# Patient Record
Sex: Female | Born: 1980 | Race: Black or African American | Hispanic: No | Marital: Married | Smoking: Never smoker
Health system: Southern US, Community
[De-identification: ages and names within clinical notes are randomized; demographics above are authoritative.]

## PROBLEM LIST (undated history)

## (undated) DIAGNOSIS — F419 Anxiety disorder, unspecified: Secondary | ICD-10-CM

## (undated) DIAGNOSIS — Z803 Family history of malignant neoplasm of breast: Secondary | ICD-10-CM

## (undated) DIAGNOSIS — Z9889 Other specified postprocedural states: Secondary | ICD-10-CM

## (undated) DIAGNOSIS — L309 Dermatitis, unspecified: Secondary | ICD-10-CM

## (undated) DIAGNOSIS — C801 Malignant (primary) neoplasm, unspecified: Secondary | ICD-10-CM

## (undated) DIAGNOSIS — J189 Pneumonia, unspecified organism: Secondary | ICD-10-CM

## (undated) DIAGNOSIS — Z8042 Family history of malignant neoplasm of prostate: Secondary | ICD-10-CM

## (undated) DIAGNOSIS — C50919 Malignant neoplasm of unspecified site of unspecified female breast: Secondary | ICD-10-CM

## (undated) DIAGNOSIS — R112 Nausea with vomiting, unspecified: Secondary | ICD-10-CM

## (undated) DIAGNOSIS — M1990 Unspecified osteoarthritis, unspecified site: Secondary | ICD-10-CM

## (undated) HISTORY — PX: THROAT SURGERY: SHX803

## (undated) HISTORY — DX: Dermatitis, unspecified: L30.9

## (undated) HISTORY — PX: SHOULDER SURGERY: SHX246

## (undated) HISTORY — PX: BREAST LUMPECTOMY: SHX2

## (undated) HISTORY — PX: ANKLE ARTHROSCOPY: SUR85

## (undated) HISTORY — DX: Family history of malignant neoplasm of breast: Z80.3

## (undated) HISTORY — PX: TARSAL TUNNEL RELEASE: SUR1099

## (undated) HISTORY — DX: Family history of malignant neoplasm of prostate: Z80.42

## (undated) HISTORY — PX: HIP SURGERY: SHX245

---

## 2001-02-20 ENCOUNTER — Emergency Department (HOSPITAL_COMMUNITY): Admission: EM | Admit: 2001-02-20 | Discharge: 2001-02-21 | Payer: Self-pay | Admitting: Emergency Medicine

## 2001-02-21 ENCOUNTER — Encounter: Payer: Self-pay | Admitting: Emergency Medicine

## 2002-08-07 ENCOUNTER — Emergency Department (HOSPITAL_COMMUNITY): Admission: EM | Admit: 2002-08-07 | Discharge: 2002-08-07 | Payer: Self-pay | Admitting: Emergency Medicine

## 2002-08-07 ENCOUNTER — Encounter: Payer: Self-pay | Admitting: Emergency Medicine

## 2003-05-24 ENCOUNTER — Encounter (INDEPENDENT_AMBULATORY_CARE_PROVIDER_SITE_OTHER): Payer: Self-pay | Admitting: *Deleted

## 2003-05-24 ENCOUNTER — Ambulatory Visit (HOSPITAL_BASED_OUTPATIENT_CLINIC_OR_DEPARTMENT_OTHER): Admission: RE | Admit: 2003-05-24 | Discharge: 2003-05-24 | Payer: Self-pay | Admitting: Otolaryngology

## 2003-05-25 ENCOUNTER — Encounter: Admission: RE | Admit: 2003-05-25 | Discharge: 2003-06-21 | Payer: Self-pay | Admitting: Otolaryngology

## 2004-01-24 ENCOUNTER — Emergency Department (HOSPITAL_COMMUNITY): Admission: EM | Admit: 2004-01-24 | Discharge: 2004-01-24 | Payer: Self-pay | Admitting: Emergency Medicine

## 2004-09-17 ENCOUNTER — Emergency Department: Payer: Self-pay | Admitting: Emergency Medicine

## 2005-03-05 ENCOUNTER — Inpatient Hospital Stay (HOSPITAL_COMMUNITY): Admission: AD | Admit: 2005-03-05 | Discharge: 2005-03-05 | Payer: Self-pay | Admitting: Obstetrics and Gynecology

## 2005-03-23 ENCOUNTER — Inpatient Hospital Stay (HOSPITAL_COMMUNITY): Admission: AD | Admit: 2005-03-23 | Discharge: 2005-03-26 | Payer: Self-pay | Admitting: Obstetrics and Gynecology

## 2010-10-21 ENCOUNTER — Inpatient Hospital Stay (HOSPITAL_COMMUNITY): Admission: AD | Admit: 2010-10-21 | Payer: Self-pay | Admitting: Obstetrics and Gynecology

## 2011-03-09 NOTE — H&P (Signed)
NAME:  DESARE, DUDDY NO.:  [id number]   MEDICAL RECORD NO.:  [id number]          PATIENT TYPE:  INP   LOCATION:  9169                          FACILITY:  [facility]   PHYSICIAN:  Lenoard Aden, M.D.DATE OF BIRTH:  January 22, 1981   DATE OF ADMISSION:  03/23/2005  DATE OF DISCHARGE:                                HISTORY & PHYSICAL   CHIEF COMPLAINT:  Post dates, presumed large for gestational age for  induction.   HISTORY OF PRESENT ILLNESS:  The patient is a 30 year old African-American  female G1, P0, EDC of March 27, 2005, at 39+ weeks with presumed LGA for  induction.   PAST MEDICAL HISTORY:  1.  Laryngoscopy in 2004 for a nodule, some question of granuloma.  2.  History of ankle surgery.  3.  She has a history of urinary tract infection.  4.  She has a history of a LEEP for abnormal Pap smear.   FAMILY HISTORY:  Diabetes, heart disease and smoking abuse.   PRENATAL LAB DATA:  Blood type O positive. Rubella immune. Hepatitis, HIV  negative.   PHYSICAL EXAMINATION:  GENERAL:  Well-developed, well-nourished African-  American female in no acute distress.  HEENT:  Normal.  LUNGS:  Clear.  HEART:  Regular rhythm.  ABDOMEN:  Soft, gravid, nontender. Estimated fetal weight 8 pounds.  PELVIC:  Cervix is 3-4 cm, 80%, vertex, 0 station.  EXTREMITIES:  Normal.  NEUROLOGIC EXAM:  Nonfocal.   IMPRESSION:  1.  Forty-week intrauterine pregnancy.  2.  Presumed large for gestational age.  3.  History of LEEP.  4.  Father of baby with sickle cell trait. Patient with normal hemoglobin      electrophoresis.   PLAN:  Proceed with induction. Risks, benefits discussed. Epidural as  needed.      RJT/MEDQ  D:  03/23/2005  T:  03/23/2005  Job:  [id number]   cc:   [facility]

## 2011-03-09 NOTE — Op Note (Signed)
   Heather, Delacruz                        ACCOUNT NO.:  [id number]   MEDICAL RECORD NO.:  [id number]                   PATIENT TYPE:  AMB   LOCATION:  DSC                                  FACILITY:  [facility]   PHYSICIAN:  Jefry H. Pollyann Kennedy, M.D.                DATE OF BIRTH:  [birth year]   DATE OF PROCEDURE:  05/24/2003  DATE OF DISCHARGE:                                 OPERATIVE REPORT   PREOPERATIVE DIAGNOSIS:  Vocal cord granulomata with severe hoarseness.   POSTOPERATIVE DIAGNOSIS:  Vocal cord granulomata with severe hoarseness.   PROCEDURE:  Microlaryngoscopy with excision of bilateral vocal cord  granulomata.   SURGEON:  Jefry H. Pollyann Kennedy, M.D.   General endotracheal anesthesia was used.  No complications.  No blood loss.   FINDINGS:  Bilateral anterior third vocal cord granulomata, otherwise normal  examination of the larynx.   HISTORY:  This is a 30 year old who has had severe hoarseness for many  years, most of her adult life.  Has undergone speech therapy without any  significant improvement.  She has had known vocal nodules for many years.  She underwent a microlaryngoscopy approximately two months prior and  developed severe hoarseness and straining of the voice again in the  postoperative period that has failed to resolve with medical management.  The risks, benefits, alternatives, and complications of the procedure were  explained to the patient, who seemed to understand and agreed to surgery.   DESCRIPTION OF PROCEDURE:  The patient was taken to the operating room and  placed on the operating table in a supine position.  Following induction of  general endotracheal anesthesia, the table was turned 90 degrees and the  patient was draped in a standard fashion.  A Dedo laryngoscope was entered  into the oral cavity and used to view the laryngeal structures.  It was  attached to the suspension apparatus without difficulty.  The camera and  microscope were brought into  view.  The larynx was examined.  The two  lesions were excised using sharp dissection, scissors, and grasping forceps.  They were sent together for pathologic evaluation.  The base of the lesion  was vascular bilaterally, and adrenalin-soaked pledgets were placed to  provide hemostasis.  The edges of the mucosa were all basically free.  No  further dissection was accomplished.  The laryngoscope was removed.  The  patient was awakened, extubated, and transferred to recovery in stable  condition.                                               Jefry H. Pollyann Kennedy, M.D.    JHR/MEDQ  D:  05/24/2003  T:  05/24/2003  Job:  [id number]

## 2011-03-22 ENCOUNTER — Inpatient Hospital Stay (HOSPITAL_COMMUNITY)
Admission: AD | Admit: 2011-03-22 | Discharge: 2011-03-22 | Disposition: A | Discharge status: Home / Self Care | Payer: BC Managed Care – PPO | Source: Ambulatory Visit | Attending: Obstetrics and Gynecology | Admitting: Obstetrics and Gynecology

## 2011-03-22 ENCOUNTER — Inpatient Hospital Stay (HOSPITAL_COMMUNITY): Payer: BC Managed Care – PPO

## 2011-03-22 ENCOUNTER — Inpatient Hospital Stay (HOSPITAL_COMMUNITY)
Admission: AD | Admit: 2011-03-22 | Discharge: 2011-03-24 | DRG: 373 | Disposition: A | Discharge status: Home / Self Care | Payer: BC Managed Care – PPO | Source: Ambulatory Visit | Attending: Obstetrics and Gynecology | Admitting: Obstetrics and Gynecology

## 2011-03-22 ENCOUNTER — Encounter (HOSPITAL_COMMUNITY): Payer: Self-pay | Admitting: *Deleted

## 2011-03-22 DIAGNOSIS — O479 False labor, unspecified: Secondary | ICD-10-CM | POA: Insufficient documentation

## 2011-03-22 LAB — CBC
Hemoglobin: 10.3 g/dL — ABNORMAL LOW (ref 12.0–15.0)
MCH: 26.5 pg (ref 26.0–34.0)
MCHC: 32.7 g/dL (ref 30.0–36.0)
Platelets: 231 10*3/uL (ref 150–400)
RDW: 14.8 % (ref 11.5–15.5)

## 2011-03-23 ENCOUNTER — Other Ambulatory Visit: Payer: Self-pay | Admitting: Obstetrics and Gynecology

## 2011-03-23 LAB — RPR: RPR Ser Ql: NONREACTIVE

## 2011-03-23 LAB — ABO/RH: ABO/RH(D): O POS

## 2011-03-23 NOTE — H&P (Signed)
  Heather Delacruz, KULIG            ACCOUNT NO.:  [id number]  MEDICAL RECORD NO.:  [id number]           PATIENT TYPE:  I  LOCATION:  9166                          FACILITY:  [facility]  PHYSICIAN:  Lenoard Aden, M.D.DATE OF BIRTH:  15-Nov-1980  DATE OF ADMISSION:  03/22/2011 DATE OF DISCHARGE:                             HISTORY & PHYSICAL   CHIEF COMPLAINT:  Spontaneous leakage of fluid.  HISTORY OF PRESENT ILLNESS:  She is a 30 year old African American female G2, P1 at 35 and 16 weeks' gestation who presents with questionable leakage of fluid.  She reports good fetal movement.  She denies bleeding, shortness of breath, or chest pain.  ALLERGIES:  She has no known drug allergies.  MEDICATIONS:  Prenatal vitamins.  FAMILY HISTORY:  Diabetes, drug abuse, hypertension, and bipolar disorder.  PAST MEDICAL HISTORY:  She has a history of vaginal delivery x1.  PAST SURGICAL HISTORY:  Remarkable for laryngoscopic surgery, LEEP, and ankle surgery.  She has a pregnancy course complicated by unexplained third trimester bleeding.  PHYSICAL EXAMINATION:  GENERAL:  She is a well-developed, well-nourished [place] American female in no acute distress. HEENT:  Normal. NECK:  Supple.  Full range of motion. LUNGS:  Clear. HEART:  Regular rhythm. ABDOMEN:  Soft, gravid, and nontender. BACK:  No CVA tenderness. EXTREMITIES:  No cords. NEUROLOGIC:  Nonfocal. SKIN:  Intact. PELVIS:  Cervical exam unchanged, 2 cm, 50% vertex, -2.  No fluid noted. NST reassuring but nonreactive.  BPP 8/8 with an AFI of 21.  IMPRESSION:  No evidence of spontaneous rupture of membranes at term.  PLAN:  Discharge home for admission tomorrow for induction at 39 weeks for history of unexplained third trimester bleeding.  Bleeding and leakage of fluid precautions given.     Lenoard Aden, M.D.     RJT/MEDQ  D:  03/22/2011  T:  03/22/2011  Job:  [id number]  Electronically Signed by Olivia Mackie M.D. on  03/23/2011 02:02:59 PM

## 2011-03-23 NOTE — H&P (Signed)
  Heather Delacruz, Heather Delacruz            ACCOUNT NO.:  [id number]  MEDICAL RECORD NO.:  [id number]           PATIENT TYPE:  LOCATION:                                 FACILITY:  PHYSICIAN:  Lenoard Aden, M.D.     DATE OF BIRTH:  DATE OF ADMISSION:  03/22/2011 DATE OF DISCHARGE:                             HISTORY & PHYSICAL   CHIEF COMPLAINT:  History of unexplained third-trimester bleeding for induction at 39 weeks.  HISTORY:  She is a 30 year old African American female G2, P1 at 69 weeks' gestation with history of bleeding and unexplained second and third-trimester for induction at 39 weeks.  She is a nonsmoker, nondrinker.  She denies domestic or physical violence.  She has no known drug allergies.  Medications are prenatal vitamins.  Family history of rheumatoid arthritis, diabetes, hypertension, bipolar disorder. Previous history of 7 pounds 6 ounces fetus born in 2005.  She has a surgical history remarkable for laryngoscopic surgery, LEEP, ankle surgery.  Prenatal course complicated by unexplained second-trimester bleeding and third-trimester bleeding in addition to a history of polyhydramnios resolved on most recent ultrasound.  PHYSICAL EXAM:  GENERAL:  Well-developed, well-nourished [place] American female, in no acute distress. HEENT:  Normal. NECK:  Supple.  Full range of motion. LUNGS:  Clear. HEART:  Regular rate and rhythm. ABDOMEN:  Soft, gravid, nontender.  Estimated fetal weight 7-8 pounds. Cervix is 2-3, 50% vertex, -2. EXTREMITIES:  There are no cords. NEUROLOGIC:  Nonfocal. SKIN:  Intact.  IMPRESSION: 1. A 39-week intrauterine pregnancy. 2. Unexplained second-trimester bleeding.  PLAN:  Proceed with cervical ripening and induction, Cervidil was placed and NST is reactive.     Lenoard Aden, M.D.     RJT/MEDQ  D:  03/22/2011  T:  03/22/2011  Job:  [id number]  Electronically Signed by Olivia Mackie M.D. on 03/23/2011 02:03:02 PM

## 2011-03-24 LAB — CBC
Hemoglobin: 9.1 g/dL — ABNORMAL LOW (ref 12.0–15.0)
MCH: 26.3 pg (ref 26.0–34.0)
MCHC: 32.4 g/dL (ref 30.0–36.0)
RDW: 14.9 % (ref 11.5–15.5)

## 2013-02-10 DIAGNOSIS — L309 Dermatitis, unspecified: Secondary | ICD-10-CM | POA: Insufficient documentation

## 2013-02-10 DIAGNOSIS — J381 Polyp of vocal cord and larynx: Secondary | ICD-10-CM | POA: Insufficient documentation

## 2013-02-10 DIAGNOSIS — M7580 Other shoulder lesions, unspecified shoulder: Secondary | ICD-10-CM | POA: Insufficient documentation

## 2014-03-02 LAB — TSH: TSH: 0.63 u[IU]/mL (ref <=5.90)

## 2014-03-11 ENCOUNTER — Ambulatory Visit (INDEPENDENT_AMBULATORY_CARE_PROVIDER_SITE_OTHER): Payer: BC Managed Care – PPO | Admitting: Internal Medicine

## 2014-03-11 ENCOUNTER — Encounter: Payer: Self-pay | Admitting: Internal Medicine

## 2014-03-11 VITALS — BP 122/82 | HR 72 | Temp 98.5°F | Resp 12 | Ht 66.0 in | Wt 193.8 lb

## 2014-03-11 DIAGNOSIS — E041 Nontoxic single thyroid nodule: Secondary | ICD-10-CM | POA: Insufficient documentation

## 2014-03-11 NOTE — Progress Notes (Signed)
Patient ID: Heather Delacruz, female   DOB: August 16, 1981, 33 y.o.   MRN: [id number]  HPI  Heather Delacruz is a 33 y.o.-year-old female, referred by Dr Brien Few fin consultation for a thyroid nodule.  She had a visit with Dr Ronita Hipps on 03/02/2014 >> he felt a nodule and referred her to me.  I reviewed pt's thyroid tests: Lab Results  Component Value Date   TSH 0.63 03/02/2014    Pt denies feeling nodules in neck, + hoarseness (vocal nodule surgery at 33 y/o), no dysphagia/odynophagia, SOB with lying down.  Pt c/o: - no heat intolerance/cold intolerance - no tremors - rarely palpitations - no anxiety/depression - no hyperdefecation/constipation - no weight loss - no weight gain - no dry skin - + hair falling >> better - + fatigue  Pt does not have a FH of thyroid ds. No FH of thyroid cancer. No h/o radiation tx to head or neck. No seaweed or kelp, no recent contrast studies. No steroid use. No herbal supplements.   I reviewed her chart and she also has a history of acne (on Spironolactone).  ROS: Constitutional: see HPI Eyes: no blurry vision, no xerophthalmia ENT: no sore throat, no nodules palpated in throat, no dysphagia/odynophagia, no hoarseness Cardiovascular: no CP/SOB/palpitations/leg swelling Respiratory: no cough/SOB Gastrointestinal: no N/V/D/C Musculoskeletal: + muscle aches/joint aches Skin: no rashes Neurological: no tremors/numbness/tingling/dizziness Psychiatric: no depression/anxiety  Past medical history: - Vitamin D deficiency - Eczema  Surgeries: - shoulder surgery 2013 - 2 focal nodules surgeries 2003/4 - tendon release in the right ankle 2000  History   Social History  . Marital Status: Married    Spouse Name: N/A    Number of Children: 2   Occupational History  . Physical therapist   Social History Main Topics  . Smoking status: Never Smoker   . Smokeless tobacco: No  . Alcohol Use: 1-2/mo  . Drug Use: No   Current Outpatient Rx   Name  Route  Sig  Dispense  Refill  . Omega-3 Fatty Acids (FISH OIL) 1000 MG CAPS   Oral   Take 1 capsule by mouth daily.         Marland Kitchen spironolactone (ALDACTONE) 50 MG tablet   Oral   Take 50 mg by mouth as needed.          . Vitamin D, Ergocalciferol, (DRISDOL) 50000 UNITS CAPS capsule   Oral   Take 50,000 Units by mouth as needed.          NKDA  FH: - diabetes mellitus in father - Hypertension in mother and father - Hyperlipidemia in mother - Cancer: prostate, in father  PE: BP 122/82  Pulse 72  Temp(Src) 98.5 F (36.9 C) (Oral)  Resp 12  Ht 5\' 6"  (1.676 m)  Wt 193 lb 12.8 oz (87.907 kg)  BMI 31.30 kg/m2  SpO2 98%  Breastfeeding? Unknown Wt Readings from Last 3 Encounters:  03/11/14 193 lb 12.8 oz (87.907 kg)   Constitutional: overweight, in NAD Eyes: PERRLA, EOMI, no exophthalmos ENT: moist mucous membranes, + ?thyroid nodule palpated in L side of isthmus or left lobe, no cervical lymphadenopathy Cardiovascular: RRR, No MRG Respiratory: CTA B Gastrointestinal: abdomen soft, NT, ND, BS+ Musculoskeletal: no deformities, strength intact in all 4;  Skin: moist, warm, no rashes Neurological: no tremor with outstretched hands, DTR normal in all 4  ASSESSMENT: 1. ?Thyroid nodule - palpated in the mid-left thyroid  PLAN: 1.  - Pt has a ?newly palpable ~ 1 cm  thyroid nodule. She does not have neck compression sxs. She does not have a thyroid cancer family history or a personal history of RxTx to head/neck. All these would favor benignity.  - we need a thyroid U/S to further characterize the nodule >> ordered - We discussed that if she has a nodule, the only way that we can tell exactly if it is cancer or not is by doing a thyroid biopsy (FNA).  - I explained that this is not cancer, we can continue to follow her on a yearly basis, and check another ultrasound in another year or 2. - I'll see her back in a year, assuming her FNA is normal. If FNA abnormal, we  will meet sooner.  - I advised pt to join my chart and I will send her the results through there   Orders Placed This Encounter  Procedures  . US Soft Tissue Head/Neck   CLINICAL DATA: This thyroid nodule  EXAM: THYROID ULTRASOUND  TECHNIQUE: Ultrasound examination of the thyroid gland and adjacent soft tissues was performed.  COMPARISON: None.  FINDINGS: Right thyroid lobe  Measurements: 5.0 x 1.7 x 1.7 cm. No nodules visualized.  Left thyroid lobe  Measurements: 3.7 x 1.5 x 1.3 cm. No nodules visualized.  Isthmus  Thickness: 4 mm in thickness. No nodules visualized.  Lymphadenopathy  None visualized.  IMPRESSION: No evidence of thyroid nodule.  No thyroid nodule - but the images show a more fluid-filled area on L side of isthmus, possibly a cyst that resorbed. No need for f/u in a year >> will let pt know.

## 2014-03-11 NOTE — Patient Instructions (Addendum)
I will order a thyroid Ultrasound in [facility].  You will be called to schedule this appt. Please join MyChart >> I will send you the results through there. Please return in 1 year.

## 2014-03-19 ENCOUNTER — Ambulatory Visit
Admission: RE | Admit: 2014-03-19 | Discharge: 2014-03-19 | Disposition: A | Discharge status: Home / Self Care | Payer: BC Managed Care – PPO | Source: Ambulatory Visit | Attending: Internal Medicine | Admitting: Internal Medicine

## 2014-03-19 ENCOUNTER — Encounter: Payer: Self-pay | Admitting: Internal Medicine

## 2014-08-16 DIAGNOSIS — B9689 Other specified bacterial agents as the cause of diseases classified elsewhere: Secondary | ICD-10-CM | POA: Insufficient documentation

## 2014-08-23 ENCOUNTER — Encounter: Payer: Self-pay | Admitting: Internal Medicine

## 2015-03-11 ENCOUNTER — Ambulatory Visit (INDEPENDENT_AMBULATORY_CARE_PROVIDER_SITE_OTHER): Payer: BLUE CROSS/BLUE SHIELD | Admitting: Internal Medicine

## 2015-03-11 ENCOUNTER — Encounter: Payer: Self-pay | Admitting: Internal Medicine

## 2015-03-11 VITALS — BP 132/80 | HR 75 | Temp 98.4°F | Resp 12 | Wt 184.0 lb

## 2015-03-11 DIAGNOSIS — E041 Nontoxic single thyroid nodule: Secondary | ICD-10-CM | POA: Diagnosis not present

## 2015-03-11 NOTE — Progress Notes (Signed)
Patient ID: Heather Delacruz, female   DOB: November 15, 1980, 34 y.o.   MRN: [id number]  HPI  [name] is a 34 y.o.-year-old female, referred by Dr Brien Few in consultation for a thyroid nodule.  She had a visit with Dr Ronita Hipps on 03/02/2014 >> he felt a nodule and referred her to me.   A thyroid U/S (03/19/2014) did not show a thyroid nodule:   Right thyroid lobe - Measurements: 5.0 x 1.7 x 1.7 cm. No nodules visualized.  Left thyroid lobe - Measurements: 3.7 x 1.5 x 1.3 cm. No nodules visualized.  Isthmus - Thickness: 4 mm in thickness. No nodules visualized.  Lymphadenopathy - None visualized. IMPRESSION: No evidence of thyroid nodule.  She tells me that Dr Ronita Hipps recently felt her thyroid nodule again.   I reviewed pt's thyroid tests: Lab Results  Component Value Date   TSH 0.63 03/02/2014    Pt denies feeling nodules in neck, + hoarseness (vocal nodule surgery at 34 y/o), no dysphagia/odynophagia, SOB with lying down.  Pt c/o: - + hair loss - no heat intolerance/cold intolerance - no tremors - rarely palpitations - no anxiety/depression - no hyperdefecation/constipation - no weight loss - no weight gain - no dry skin - no fatigue  ROS: Constitutional: see HPI Eyes: no blurry vision, no xerophthalmia ENT: no sore throat, no nodules palpated in throat, no dysphagia/odynophagia, no hoarseness Cardiovascular: no CP/SOB/palpitations/leg swelling Respiratory: no cough/SOB Gastrointestinal: no N/V/D/C Musculoskeletal: no muscle aches/+ joint aches Skin: no rashes Neurological: no tremors/numbness/tingling/dizziness  I reviewed pt's medications, allergies, PMH, social hx, family hx, and changes were documented in the history of present illness. Otherwise, unchanged from my initial visit note.  Past medical history: - Vitamin D deficiency - Eczema  Surgeries: - shoulder surgery 2013 - 2 focal nodules surgeries 2003/4 - tendon release in the right ankle  2000  History   Social History  . Marital Status: Married    Spouse Name: N/A    Number of Children: 2   Occupational History  . Physical therapist   Social History Main Topics  . Smoking status: Never Smoker   . Smokeless tobacco: No  . Alcohol Use: 1-2/mo  . Drug Use: No   Current Outpatient Rx  Name  Route  Sig  Dispense  Refill  . Omega-3 Fatty Acids (FISH OIL) 1000 MG CAPS   Oral   Take 1 capsule by mouth daily.         . Vitamin D, Ergocalciferol, (DRISDOL) 50000 UNITS CAPS capsule   Oral   Take 50,000 Units by mouth as needed.         Marland Kitchen spironolactone (ALDACTONE) 50 MG tablet   Oral   Take 50 mg by mouth as needed.           NKDA  FH: - diabetes mellitus in father - Hypertension in mother and father - Hyperlipidemia in mother - Cancer: prostate, in father  PE: BP 132/80 mmHg  Pulse 75  Temp(Src) 98.4 F (36.9 C) (Oral)  Resp 12  Wt 184 lb (83.462 kg)  SpO2 99% Wt Readings from Last 3 Encounters:  03/11/15 184 lb (83.462 kg)  03/11/14 193 lb 12.8 oz (87.907 kg)   Constitutional: overweight, in NAD Eyes: PERRLA, EOMI, no exophthalmos ENT: moist mucous membranes, + palpable thyroid isthmus, no cervical lymphadenopathy Cardiovascular: RRR, No MRG Respiratory: CTA B Gastrointestinal: abdomen soft, NT, ND, BS+ Musculoskeletal: no deformities, strength intact in all 4;  Skin: moist, warm, no rashes Neurological:  no tremor with outstretched hands, DTR normal in all 4  ASSESSMENT: 1. ?Thyroid nodule - palpated in the mid-left thyroid >> not observed on U/S from 03/19/2014  PLAN: 1.  - Pt has a palpable isthmus - on the last thyroid U/S, it is apparent that she has a larger isthmus, but not quite a nodule. She does not have neck compression sxs. She does not have a thyroid cancer family history or a personal history of RxTx to head/neck.  - we discussed about obtaining a new thyroid U/S to recheck of her thyroid status - I believe the mass felt  is her enlarged isthmus, but will recheck if she did not develop a nodule >> she agrees - I will see her prn, but no U/S needed in the future if the new thyroid U/S returns normal - I advised pt to join my chart and I will send her the results through there   CLINICAL DATA: Multiple left thyroid nodule on physical examination.  EXAM: THYROID ULTRASOUND  TECHNIQUE: Ultrasound examination of the thyroid gland and adjacent soft tissues was performed.  COMPARISON: 03/19/2014  FINDINGS: Right thyroid lobe  Measurements: 5.1 x 1.2 x 1.7 cm. No nodules visualized.  Left thyroid lobe  Measurements: 3.8 x 1.0 x 1.4 cm. No nodules visualized. Small 0.2 cm cyst has a benign appearance. This is visible on the prior study as well.  Isthmus  Thickness: 0.4 cm. No nodules visualized.  Lymphadenopathy  None visualized.  IMPRESSION: Stable thyroid ultrasound demonstrating slightly larger right lobe and no solid nodules. 0.2 cm cyst of the left lobe appears stable and benign.   Electronically Signed By: Aletta Edouard M.D. On: 03/14/2015 16:55  No thyroid nodule demonstrated. No need for further ultrasounds. I will see the patient back on an as needed basis.

## 2015-03-11 NOTE — Patient Instructions (Signed)
Please schedule another U/S of your thyroid.

## 2015-03-14 ENCOUNTER — Ambulatory Visit
Admission: RE | Admit: 2015-03-14 | Discharge: 2015-03-14 | Disposition: A | Discharge status: Home / Self Care | Payer: BLUE CROSS/BLUE SHIELD | Source: Ambulatory Visit | Attending: Internal Medicine | Admitting: Internal Medicine

## 2015-08-11 ENCOUNTER — Other Ambulatory Visit: Payer: Self-pay | Admitting: Orthopedic Surgery

## 2015-08-11 DIAGNOSIS — M545 Low back pain: Secondary | ICD-10-CM

## 2015-08-11 DIAGNOSIS — M5459 Other low back pain: Secondary | ICD-10-CM

## 2015-08-18 ENCOUNTER — Other Ambulatory Visit: Payer: Self-pay | Admitting: Orthopedic Surgery

## 2015-08-18 ENCOUNTER — Ambulatory Visit
Admission: RE | Admit: 2015-08-18 | Discharge: 2015-08-18 | Disposition: A | Discharge status: Home / Self Care | Payer: Worker's Compensation | Source: Ambulatory Visit | Attending: Orthopedic Surgery | Admitting: Orthopedic Surgery

## 2015-08-18 DIAGNOSIS — M545 Low back pain: Secondary | ICD-10-CM

## 2015-08-18 DIAGNOSIS — M5459 Other low back pain: Secondary | ICD-10-CM

## 2015-08-18 MED ORDER — IOHEXOL 180 MG/ML  SOLN
1.0000 mL | Freq: Once | INTRAMUSCULAR | Status: DC | PRN
  Administered 2015-08-18: 1 mL via INTRA_ARTICULAR

## 2015-08-18 MED ORDER — METHYLPREDNISOLONE ACETATE 40 MG/ML INJ SUSP (RADIOLOG
120.0000 mg | Freq: Once | INTRAMUSCULAR | Status: AC
  Administered 2015-08-18: 120 mg via INTRA_ARTICULAR

## 2015-08-18 NOTE — Discharge Instructions (Signed)

## 2016-10-01 DIAGNOSIS — M25551 Pain in right hip: Secondary | ICD-10-CM | POA: Insufficient documentation

## 2016-10-01 DIAGNOSIS — S73191A Other sprain of right hip, initial encounter: Secondary | ICD-10-CM | POA: Insufficient documentation

## 2018-03-08 ENCOUNTER — Encounter (HOSPITAL_BASED_OUTPATIENT_CLINIC_OR_DEPARTMENT_OTHER): Payer: Self-pay | Admitting: Emergency Medicine

## 2018-03-08 ENCOUNTER — Emergency Department (HOSPITAL_BASED_OUTPATIENT_CLINIC_OR_DEPARTMENT_OTHER): Payer: BLUE CROSS/BLUE SHIELD

## 2018-03-08 ENCOUNTER — Emergency Department (HOSPITAL_BASED_OUTPATIENT_CLINIC_OR_DEPARTMENT_OTHER)
Admission: EM | Admit: 2018-03-08 | Discharge: 2018-03-08 | Disposition: A | Discharge status: Home / Self Care | Payer: BLUE CROSS/BLUE SHIELD | Attending: Emergency Medicine | Admitting: Emergency Medicine

## 2018-03-08 ENCOUNTER — Other Ambulatory Visit: Payer: Self-pay

## 2018-03-08 DIAGNOSIS — Y999 Unspecified external cause status: Secondary | ICD-10-CM | POA: Diagnosis not present

## 2018-03-08 DIAGNOSIS — Z79899 Other long term (current) drug therapy: Secondary | ICD-10-CM | POA: Diagnosis not present

## 2018-03-08 DIAGNOSIS — Y939 Activity, unspecified: Secondary | ICD-10-CM | POA: Insufficient documentation

## 2018-03-08 DIAGNOSIS — S99911A Unspecified injury of right ankle, initial encounter: Secondary | ICD-10-CM | POA: Diagnosis present

## 2018-03-08 DIAGNOSIS — S86011A Strain of right Achilles tendon, initial encounter: Secondary | ICD-10-CM | POA: Diagnosis not present

## 2018-03-08 DIAGNOSIS — Y33XXXA Other specified events, undetermined intent, initial encounter: Secondary | ICD-10-CM | POA: Diagnosis not present

## 2018-03-08 DIAGNOSIS — Y92830 Public park as the place of occurrence of the external cause: Secondary | ICD-10-CM | POA: Insufficient documentation

## 2018-03-08 MED ORDER — HYDROCODONE-ACETAMINOPHEN 5-325 MG PO TABS: 1.0000 | ORAL_TABLET | ORAL | 0 refills | Status: DC | PRN

## 2018-03-08 NOTE — ED Triage Notes (Signed)
States she felt a pop to her achilles 30 min ago after exercising  and has had pain and swelling ever since.

## 2018-03-08 NOTE — ED Notes (Signed)
Patient refused the crutches.

## 2018-03-08 NOTE — ED Notes (Signed)
Pt in Radiology 

## 2018-03-08 NOTE — ED Notes (Signed)
ED Provider at bedside. 

## 2018-03-09 NOTE — ED Provider Notes (Signed)
[facility] EMERGENCY DEPARTMENT Provider Note   CSN: [id number] Arrival date & time: 03/08/18  1233     History   Chief Complaint Chief Complaint  Patient presents with  . Ankle Pain    HPI Heather Delacruz is a 37 y.o. female.  The history is provided by the patient. No language interpreter was used.  Ankle Pain   The incident occurred 1 to 2 hours ago. [place] The injury mechanism is unknown. The pain is present in the right ankle and right heel. The quality of the pain is described as aching. The pain is at a severity of 5/10. The pain has been constant since onset. She reports no foreign bodies present. She has tried nothing for the symptoms. The treatment provided no relief.  Pt complains of pain in her heel and in her achilles.  Pt thinks she may have torn her achilles.  Pt heard a pop.  Pt cannot flex foot. 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 No pertinent past medical history.  Patient Active Problem List   Diagnosis Date Noted  . Thyroid nodule 03/11/2014    Past Surgical History:  Procedure Laterality Date  . ANKLE ARTHROSCOPY    . HIP SURGERY       OB History    Gravida  1   Para      Term      Preterm      AB      Living        SAB      TAB      Ectopic      Multiple      Live Births               Home Medications    Prior to Admission medications   Medication Sig Start Date End Date Taking? Authorizing Provider  HYDROcodone-acetaminophen (NORCO/VICODIN) 5-325 MG tablet Take 1-2 tablets by mouth every 4 (four) hours as needed. 03/08/18   Fransico Meadow, PA-C  Omega-3 Fatty Acids (FISH OIL) 1000 MG CAPS Take 1 capsule by mouth daily.    [provider]  spironolactone (ALDACTONE) 50 MG tablet Take 50 mg by mouth as needed.  02/03/14   [provider]  Vitamin D, Ergocalciferol, (DRISDOL) 50000 UNITS CAPS capsule Take 50,000  Units by mouth as needed.    [provider]    Family History No family history on file.  Social History Social History   Tobacco Use  . Smoking status: Never Smoker  . Smokeless tobacco: Never Used  Substance Use Topics  . Alcohol use: Not on file  . Drug use: Not on file     Allergies   Patient has no known allergies.   Review of Systems Review of Systems  All other systems reviewed and are negative.    Physical Exam Updated Vital Signs BP 121/89 (BP Location: Right Arm)   Pulse 80   Temp 98.4 F (36.9 C) (Oral)   Resp 18   Ht 5\' 6"  (1.676 m)   Wt 84.8 kg (187 lb)   LMP 02/08/2018   SpO2 100%   BMI 30.18 kg/m   Physical Exam  Constitutional: She appears well-developed and well-nourished.  HENT:  Head: Normocephalic.  Eyes: Pupils are equal, round, and reactive to light.  Neck: Normal range of motion.  Cardiovascular: Normal rate.  Pulmonary/Chest: Effort normal.  Musculoskeletal: She exhibits  tenderness and deformity.  Swollen posterior ankle,  Pain with movement,  nv and ns intact   Neurological: She is alert.  Skin: Skin is warm.  Psychiatric: She has a normal mood and affect.  Nursing note and vitals reviewed.    ED Treatments / Results  Labs (all labs ordered are listed, but only abnormal results are displayed) Labs Reviewed - No data to display  EKG None  Radiology Dg Ankle Complete Right  Result Date: 03/08/2018 CLINICAL DATA:  Twisted right ankle today with pain and swelling. EXAM: RIGHT ANKLE - COMPLETE 3+ VIEW COMPARISON:  None. FINDINGS: Ankle mortise is normal. There is subtle focal cortical irregularity along the posterior aspect of the distal fibula seen only on the lateral view likely within normal although a nondisplaced fracture is possible. There is a surgical anchor over the medial aspect of the navicular bone. Remainder of the exam is unremarkable. IMPRESSION: No acute findings. Electronically Signed   By: Marin Olp M.D.   On: 03/08/2018 13:29   Mr Ankle Right Wo Contrast  Result Date: 03/08/2018 CLINICAL DATA:  Posterior ankle pain after twisting injury and feeling a pop. Evaluate Achilles tendon. EXAM: MRI OF THE RIGHT ANKLE WITHOUT CONTRAST TECHNIQUE: Multiplanar, multisequence MR imaging of the ankle was performed. No intravenous contrast was administered. COMPARISON:  Right ankle x-rays from same day. FINDINGS: TENDONS Peroneal: Peroneal longus tendon intact. Peroneal brevis intact. Posteromedial: Posterior tibial tendon intact. Suture anchor in the medial navicular may be related to prior posterior tibial tendon repair. Flexor hallucis longus tendon intact. Flexor digitorum longus tendon intact. Anterior: Tibialis anterior tendon intact. Extensor hallucis longus tendon intact Extensor digitorum longus tendon intact. Achilles: Full-thickness tear of the proximal to mid Achilles tendon, approximately 5 cm from the calcaneal attachment. The tendon gap measures approximately 2.1 cm. Tendinosis of the Achilles tendon below the tear. Plantar Fascia: Intact. LIGAMENTS Lateral: The anterior talofibular ligament is not visualized and likely chronically torn. Calcaneofibular ligament intact. Posterior talofibular ligament intact. Anterior and posterior tibiofibular ligaments intact. Medial: Deltoid ligament intact. Spring ligament intact. CARTILAGE Ankle Joint: No joint effusion. Normal ankle mortise. No chondral defect. Subtalar Joints/Sinus Tarsi: Normal subtalar joints. No subtalar joint effusion. Normal sinus tarsi. Bones: No marrow signal abnormality.  No fracture or dislocation. Soft Tissue: Soft tissue swelling and hematoma surrounding the Achilles tendon tear. No soft tissue mass. IMPRESSION: 1. Full-thickness tear of the proximal to mid Achilles tendon, approximately 5 cm from the calcaneal attachment. Tendon gap measures approximately 2.1 cm. Electronically Signed   By: Titus Dubin M.D.   On: 03/08/2018  15:50    Procedures Procedures (including critical care time)  Medications Ordered in ED Medications - No data to display   Initial Impression / Assessment and Plan / ED Course  I have reviewed the triage vital signs and the nursing notes.  Pertinent labs & imaging results that were available during my care of the patient were reviewed by me and considered in my medical decision making (see chart for details).     MRI shows complete tear.  Pt has seen [facility] in [place].   I gave pt the name of our Orthopaedist to see or she can she her previous orthopaedist   Pt placed in posterior splint and given crutches.  Final Clinical Impressions(s) / ED Diagnoses   Final diagnoses:  Achilles tendon tear, right, initial encounter    ED Discharge Orders        Ordered    HYDROcodone-acetaminophen (  NORCO/VICODIN) 5-325 MG tablet  Every 4 hours PRN     03/08/18 1605    An After Visit Summary was printed and given to the patient.   Fransico Meadow, Hershal Coria 03/09/18 1706    Quintella Reichert, MD 03/12/18 1235

## 2018-03-10 DIAGNOSIS — S86001A Unspecified injury of right Achilles tendon, initial encounter: Secondary | ICD-10-CM | POA: Insufficient documentation

## 2018-03-10 DIAGNOSIS — S86011A Strain of right Achilles tendon, initial encounter: Secondary | ICD-10-CM | POA: Insufficient documentation

## 2018-08-13 DIAGNOSIS — M1990 Unspecified osteoarthritis, unspecified site: Secondary | ICD-10-CM | POA: Insufficient documentation

## 2018-11-19 IMAGING — MR MR ANKLE*R* W/O CM
4 of 7 series · 19 of 40 positions shown · non-contrast
Comparison: Right ankle x-rays from same day.

CLINICAL DATA: Posterior ankle pain after twisting injury and
feeling a pop. Evaluate Achilles tendon.

EXAM:
MRI OF THE RIGHT ANKLE WITHOUT CONTRAST
TECHNIQUE: Multiplanar, multisequence MR imaging of the ankle was performed. No
intravenous contrast was administered.

[Series 3: T2 fat-sat · axial · 4.0mm · 0.29mm/px · z∈[-113,+17]mm · 5 of 32 slices shown (1 of 3)]
[im 1/32]
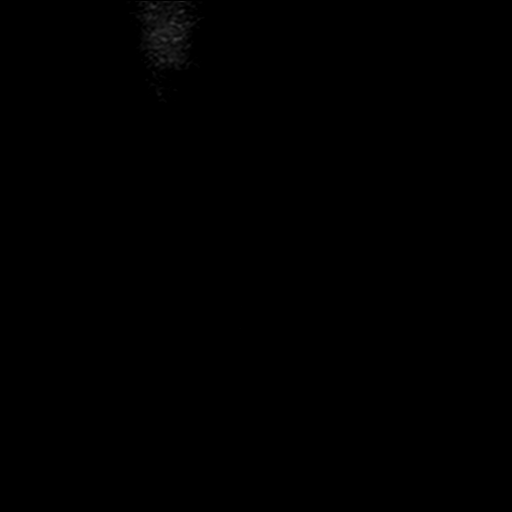
[im 5/32]
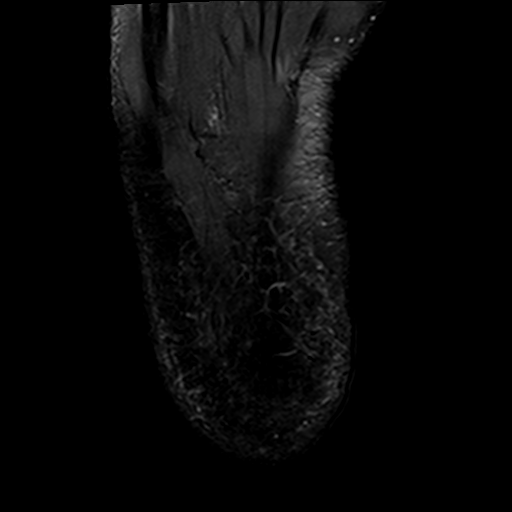
[im 9/32]
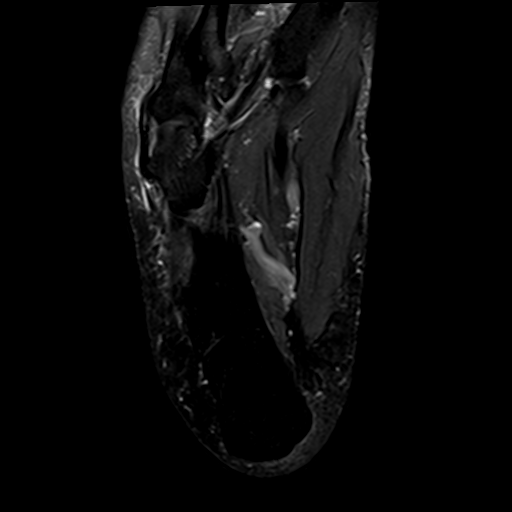
[im 18/32]
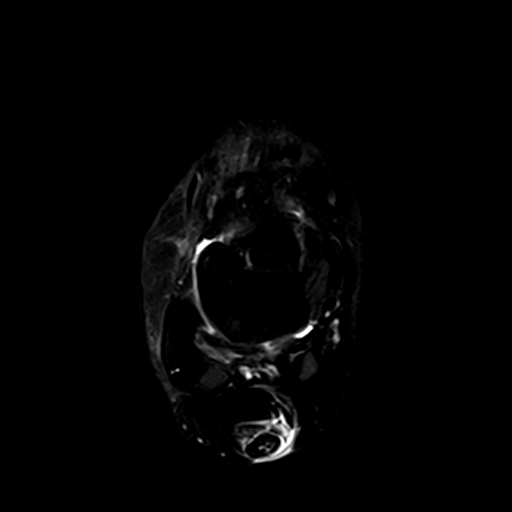
[im 27/32]
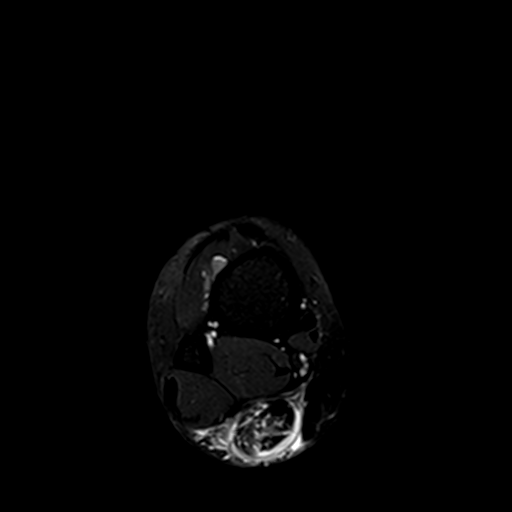

[Series 4: PD fat-sat · axial · 4.0mm · 0.29mm/px · z∈[-113,+42]mm · 8 of 32 slices shown]
[im 1/32]
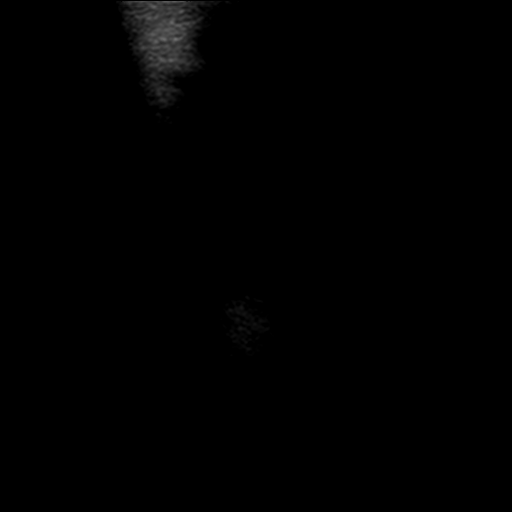
[im 5/32]
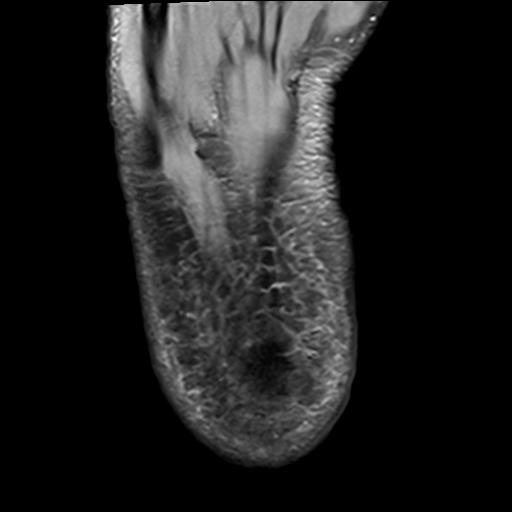
[im 9/32]
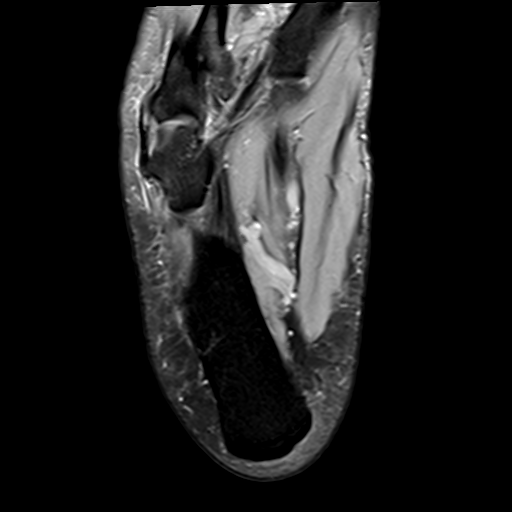
[im 14/32]
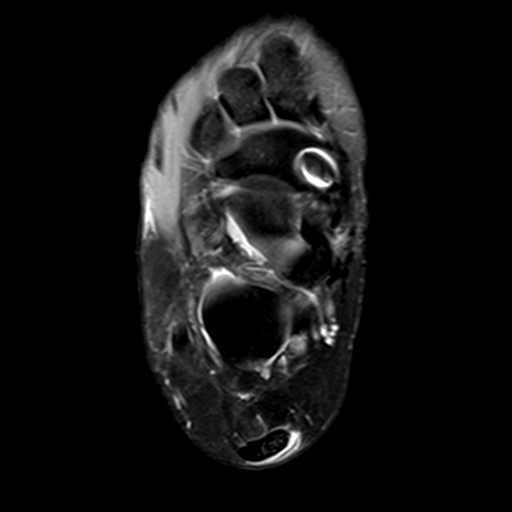
[im 18/32]
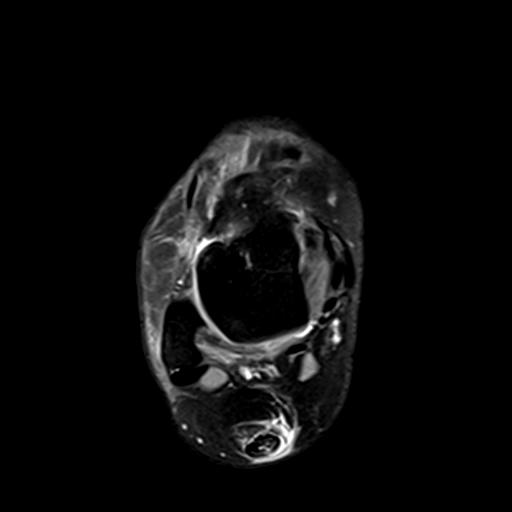
[im 23/32]
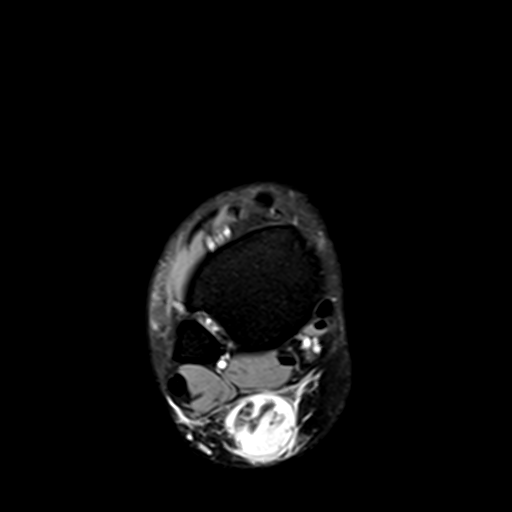
[im 27/32]
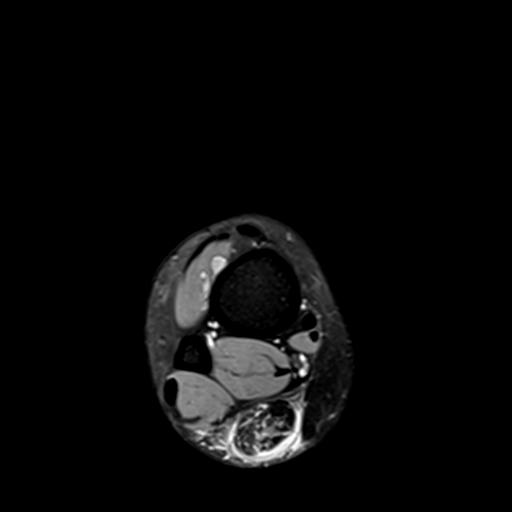
[im 32/32]
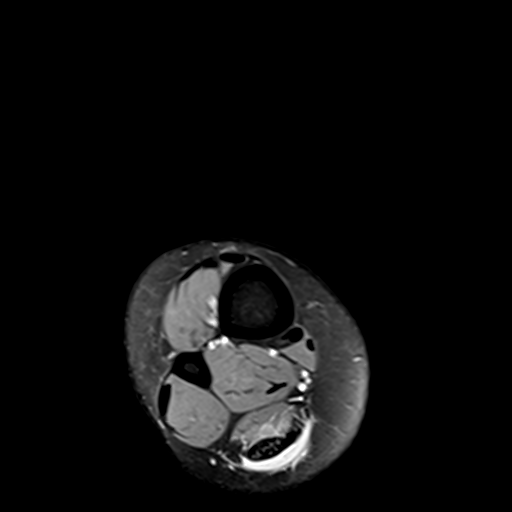

[Series 6: T2 fat-sat · sagittal · 3.0mm · 0.33mm/px · 3 of 24 slices shown (2 of 3)]
[im 1/24]
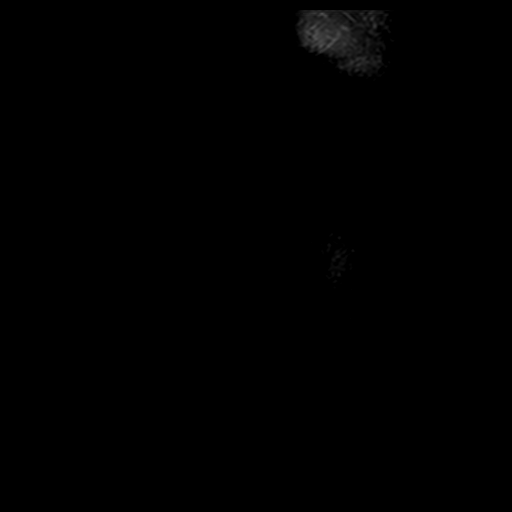
[im 12/24]
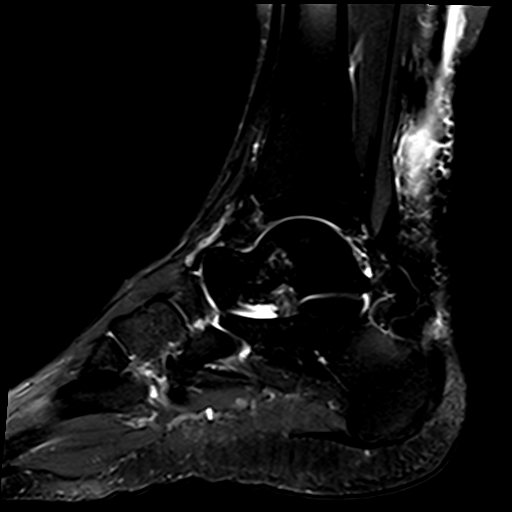
[im 24/24]
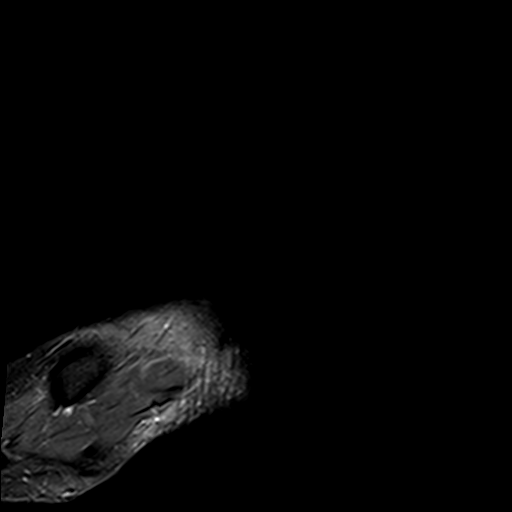

[Series 7: T2 fat-sat · coronal · 4.0mm · 0.33mm/px · 3 of 28 slices shown (3 of 3)]
[im 6/28]
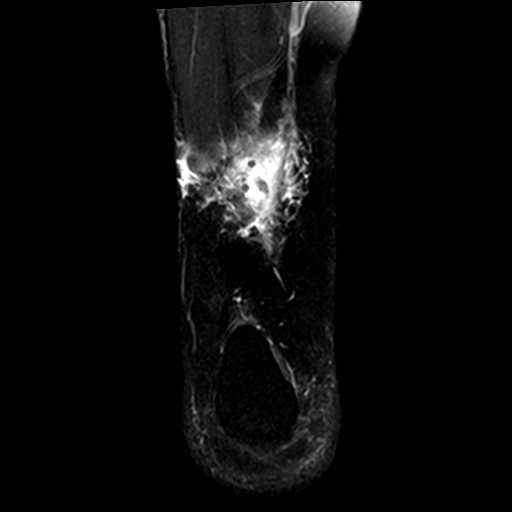
[im 17/28]
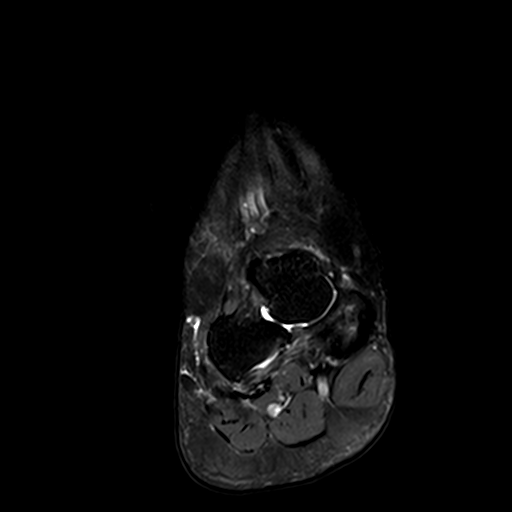
[im 28/28]
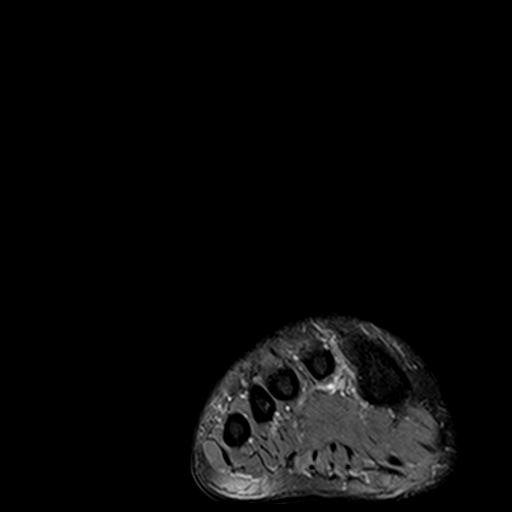

[19 of 40 positions shown; findings below may reference images not displayed]

FINDINGS: TENDONS

Peroneal: Peroneal longus tendon intact. Peroneal brevis intact.

Posteromedial: Posterior tibial tendon intact. Suture anchor in the
medial navicular may be related to prior posterior tibial tendon
repair. Flexor hallucis longus tendon intact. Flexor digitorum
longus tendon intact.

Anterior: Tibialis anterior tendon intact. Extensor hallucis longus
tendon intact Extensor digitorum longus tendon intact.

Achilles: Full-thickness tear of the proximal to mid Achilles
tendon, approximately 5 cm from the calcaneal attachment. The tendon
gap measures approximately 2.1 cm. Tendinosis of the Achilles tendon
below the tear.

Plantar Fascia: Intact.

LIGAMENTS

Lateral: The anterior talofibular ligament is not visualized and
likely chronically torn. Calcaneofibular ligament intact. Posterior
talofibular ligament intact. Anterior and posterior tibiofibular
ligaments intact.

Medial: Deltoid ligament intact. Spring ligament intact.

CARTILAGE

Ankle Joint: No joint effusion. Normal ankle mortise. No chondral
defect.

Subtalar Joints/Sinus Tarsi: Normal subtalar joints. No subtalar
joint effusion. Normal sinus tarsi.

Bones: No marrow signal abnormality.  No fracture or dislocation.

Soft Tissue: Soft tissue swelling and hematoma surrounding the
Achilles tendon tear. No soft tissue mass.
IMPRESSION: 1. Full-thickness tear of the proximal to mid Achilles tendon,
approximately 5 cm from the calcaneal attachment. Tendon gap
measures approximately 2.1 cm.

## 2019-06-12 ENCOUNTER — Other Ambulatory Visit: Payer: Self-pay | Admitting: Obstetrics and Gynecology

## 2019-06-12 DIAGNOSIS — R928 Other abnormal and inconclusive findings on diagnostic imaging of breast: Secondary | ICD-10-CM

## 2019-06-18 ENCOUNTER — Ambulatory Visit
Admission: RE | Admit: 2019-06-18 | Discharge: 2019-06-18 | Disposition: A | Discharge status: Home / Self Care | Payer: BC Managed Care – PPO | Source: Ambulatory Visit | Attending: Obstetrics and Gynecology | Admitting: Obstetrics and Gynecology

## 2019-06-18 ENCOUNTER — Other Ambulatory Visit: Payer: Self-pay | Admitting: Obstetrics and Gynecology

## 2019-06-18 ENCOUNTER — Other Ambulatory Visit: Payer: Self-pay

## 2019-06-18 DIAGNOSIS — R921 Mammographic calcification found on diagnostic imaging of breast: Secondary | ICD-10-CM

## 2019-06-18 DIAGNOSIS — R928 Other abnormal and inconclusive findings on diagnostic imaging of breast: Secondary | ICD-10-CM

## 2019-06-19 ENCOUNTER — Encounter: Payer: Self-pay | Admitting: *Deleted

## 2019-06-19 ENCOUNTER — Telehealth: Payer: Self-pay | Admitting: Hematology and Oncology

## 2019-06-19 DIAGNOSIS — D0511 Intraductal carcinoma in situ of right breast: Secondary | ICD-10-CM | POA: Insufficient documentation

## 2019-06-19 NOTE — Telephone Encounter (Signed)
Spoke with patient to confirm afternoon [facility] appointment for 9/2, packet emailed to patient

## 2019-06-23 NOTE — Progress Notes (Signed)
[facility] NOTE  Patient Care Team: Nena Polio, NP as PCP - General (Family Medicine) Mauro Kaufmann, RN as Oncology Nurse Navigator Rockwell Germany, RN as Oncology Nurse Navigator Jovita Kussmaul, MD as Consulting Physician (General Surgery) Nicholas Lose, MD as Consulting Physician (Hematology and Oncology) Kyung Rudd, MD as Consulting Physician (Radiation Oncology)  CHIEF COMPLAINTS/PURPOSE OF CONSULTATION:  Newly diagnosed breast cancer  HISTORY OF PRESENTING ILLNESS:  Heather Delacruz 38 y.o. female is here because of recent diagnosis of ductal carcinoma in situ of the right breast. The cancer was detected on the patient's baseline routine screening mammogram on 06/18/19. Right diagnostic mammogram on 06/18/19 showed two groups of indeterminate calcifications in the UOQ of the right breast measuring 1.2cm and 0.8cm. Biopsy on 06/18/19 showed DCIS and LCIS arising in a complex sclerosing lesion, high grade, ER+ 90%, PR+ 100% at posterior depth, and high grade DCIS, ER+ 100%, PR+ 70%, at middle depth. She presents to the clinic today for initial evaluation and discussion of treatment options.   I reviewed her records extensively and collaborated the history with the patient.  SUMMARY OF ONCOLOGIC HISTORY: Oncology History  Ductal carcinoma in situ (DCIS) of right breast  06/19/2019 Initial Diagnosis   Baseline routine screening mammogram showed two groups of indeterminate calcifications in the UOQ of the right breast, 1.2cm and 0.8cm. 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     MEDICAL HISTORY:  History reviewed. No pertinent past medical history.  SURGICAL HISTORY: Past Surgical History:  Procedure Laterality Date  . ANKLE ARTHROSCOPY    . HIP SURGERY    . TARSAL TUNNEL RELEASE      SOCIAL HISTORY: Social History    Socioeconomic History  . Marital status: Married    Spouse name: Not on file  . Number of children: Not on file  . Years of education: Not on file  . Highest education level: Not on file  Occupational History  . Not on file  Social Needs  . Financial resource strain: Not on file  . Food insecurity    Worry: Not on file    Inability: Not on file  . Transportation needs    Medical: Not on file    Non-medical: Not on file  Tobacco Use  . Smoking status: Never Smoker  . Smokeless tobacco: Never Used  Substance and Sexual Activity  . Alcohol use: Yes    Comment: social  . Drug use: Never  . Sexual activity: Not on file  Lifestyle  . Physical activity    Days per week: Not on file    Minutes per session: Not on file  . Stress: Not on file  Relationships  . Social Herbalist on phone: Not on file    Gets together: Not on file    Attends religious service: Not on file    Active member of club or organization: Not on file    Attends meetings of clubs or organizations: Not on file    Relationship status: Not on file  . Intimate partner violence    Fear of current or ex partner: Not on file    Emotionally abused: Not on file    Physically abused: Not on file    Forced sexual activity: Not on file  Other Topics Concern  . Not on file  Social History Narrative  . Not on file    FAMILY HISTORY: Family History  Problem Relation Age of Onset  . Prostate cancer Father   . Cervical cancer Sister   . Throat cancer Maternal Grandfather   . Breast cancer Paternal Grandmother   . Breast cancer Paternal Aunt   . Prostate cancer Paternal Uncle     ALLERGIES:  has No Known Allergies.  MEDICATIONS:  No current outpatient medications on file.   No current facility-administered medications for this visit.     REVIEW OF SYSTEMS:   Constitutional: Denies fevers, chills or abnormal night sweats Eyes: Denies blurriness of vision, double vision or watery eyes Ears,  nose, mouth, throat, and face: Denies mucositis or sore throat Respiratory: Denies cough, dyspnea or wheezes Cardiovascular: Denies palpitation, chest discomfort or lower extremity swelling Gastrointestinal:  Denies nausea, heartburn or change in bowel habits Skin: Denies abnormal skin rashes Lymphatics: Denies new lymphadenopathy or easy bruising Neurological:Denies numbness, tingling or new weaknesses Behavioral/Psych: Mood is stable, no new changes  Breast: Denies any palpable lumps or discharge All other systems were reviewed with the patient and are negative.  PHYSICAL EXAMINATION: ECOG PERFORMANCE STATUS: 1 - Symptomatic but completely ambulatory  Vitals:   06/24/19 1305  BP: (!) 129/109  Pulse: 75  Resp: 18  Temp: 98.9 F (37.2 C)  SpO2: 100%   Filed Weights   06/24/19 1305  Weight: 198 lb 6.4 oz (90 kg)    GENERAL:alert, no distress and comfortable SKIN: skin color, texture, turgor are normal, no rashes or significant lesions EYES: normal, conjunctiva are pink and non-injected, sclera clear OROPHARYNX:no exudate, no erythema and lips, buccal mucosa, and tongue normal  NECK: supple, thyroid normal size, non-tender, without nodularity LYMPH:  no palpable lymphadenopathy in the cervical, axillary or inguinal LUNGS: clear to auscultation and percussion with normal breathing effort HEART: regular rate & rhythm and no murmurs and no lower extremity edema ABDOMEN:abdomen soft, non-tender and normal bowel sounds Musculoskeletal:no cyanosis of digits and no clubbing  PSYCH: alert & oriented x 3 with fluent speech NEURO: no focal motor/sensory deficits BREAST: No palpable nodules in breast. No palpable axillary or supraclavicular lymphadenopathy (exam performed in the presence of a chaperone)   LABORATORY DATA:  I have reviewed the data as listed Lab Results  Component Value Date   WBC 6.8 06/24/2019   HGB 13.2 06/24/2019   HCT 39.9 06/24/2019   MCV 86.0 06/24/2019    PLT 352 06/24/2019   Lab Results  Component Value Date   NA 140 06/24/2019   K 3.8 06/24/2019   CL 105 06/24/2019   CO2 27 06/24/2019    RADIOGRAPHIC STUDIES: I have personally reviewed the radiological reports and agreed with the findings in the report.  ASSESSMENT AND PLAN:  Ductal carcinoma in situ (DCIS) of right breast 06/19/2019:Baseline routine screening mammogram showed two groups of indeterminate calcifications in the UOQ of the right breast, 1.2cm and 0.8cm. 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  Pathology review: I discussed with the patient the difference between DCIS and invasive breast cancer. It is considered a precancerous lesion. DCIS is classified as a 0. It is generally detected through mammograms as calcifications. We discussed the significance of grades and its impact on prognosis. We also discussed the importance of ER and PR receptors and their implications to adjuvant treatment  options. Prognosis of DCIS dependence on grade, comedo necrosis. It is anticipated that if not treated, 20-30% of DCIS can develop into invasive breast cancer.  Recommendation: 1. Breast conserving surgery 2. Followed by adjuvant radiation therapy 3. Followed by antiestrogen therapy with tamoxifen 5 years 4.  Genetics consultation  Tamoxifen counseling: We discussed the risks and benefits of tamoxifen. These include but not limited to insomnia, hot flashes, mood changes, vaginal dryness, and weight gain. Although rare, serious side effects including endometrial cancer, risk of blood clots were also discussed. We strongly believe that the benefits far outweigh the risks. Patient understands these risks and consented to starting treatment. Planned treatment duration is 5 years.  Return to clinic after surgery to discuss the final pathology report and come up  with an adjuvant treatment plan.   All questions were answered. The patient knows to call the clinic with any problems, questions or concerns.   Rulon Eisenmenger, MD 06/24/2019    I, Molly Dorshimer, am acting as scribe for Nicholas Lose, MD.  I have reviewed the above documentation for accuracy and completeness, and I agree with the above.

## 2019-06-24 ENCOUNTER — Encounter: Payer: Self-pay | Admitting: Hematology and Oncology

## 2019-06-24 ENCOUNTER — Other Ambulatory Visit: Payer: Self-pay

## 2019-06-24 ENCOUNTER — Inpatient Hospital Stay: Payer: BC Managed Care – PPO

## 2019-06-24 ENCOUNTER — Ambulatory Visit (HOSPITAL_BASED_OUTPATIENT_CLINIC_OR_DEPARTMENT_OTHER): Payer: BC Managed Care – PPO | Admitting: Licensed Clinical Social Worker

## 2019-06-24 ENCOUNTER — Ambulatory Visit: Payer: BC Managed Care – PPO | Admitting: Physical Therapy

## 2019-06-24 ENCOUNTER — Encounter: Payer: Self-pay | Admitting: Licensed Clinical Social Worker

## 2019-06-24 ENCOUNTER — Ambulatory Visit
Admission: RE | Admit: 2019-06-24 | Discharge: 2019-06-24 | Disposition: A | Discharge status: Home / Self Care | Payer: BC Managed Care – PPO | Source: Ambulatory Visit | Attending: Radiation Oncology | Admitting: Radiation Oncology

## 2019-06-24 ENCOUNTER — Inpatient Hospital Stay: Payer: BC Managed Care – PPO | Attending: Hematology and Oncology | Admitting: Hematology and Oncology

## 2019-06-24 DIAGNOSIS — D0511 Intraductal carcinoma in situ of right breast: Secondary | ICD-10-CM

## 2019-06-24 DIAGNOSIS — Z170 Estrogen receptor positive status [ER+]: Secondary | ICD-10-CM | POA: Diagnosis not present

## 2019-06-24 DIAGNOSIS — Z8042 Family history of malignant neoplasm of prostate: Secondary | ICD-10-CM | POA: Insufficient documentation

## 2019-06-24 DIAGNOSIS — Z803 Family history of malignant neoplasm of breast: Secondary | ICD-10-CM | POA: Insufficient documentation

## 2019-06-24 LAB — CBC WITH DIFFERENTIAL (CANCER CENTER ONLY)
Abs Immature Granulocytes: 0.03 10*3/uL (ref 0.00–0.07)
Basophils Absolute: 0.1 10*3/uL (ref 0.0–0.1)
Basophils Relative: 1 %
Eosinophils Absolute: 0.1 10*3/uL (ref 0.0–0.5)
Eosinophils Relative: 1 %
HCT: 39.9 % (ref 36.0–46.0)
Hemoglobin: 13.2 g/dL (ref 12.0–15.0)
Immature Granulocytes: 0 %
Lymphocytes Relative: 25 %
Lymphs Abs: 1.7 10*3/uL (ref 0.7–4.0)
MCH: 28.4 pg (ref 26.0–34.0)
MCHC: 33.1 g/dL (ref 30.0–36.0)
MCV: 86 fL (ref 80.0–100.0)
Monocytes Absolute: 0.6 10*3/uL (ref 0.1–1.0)
Monocytes Relative: 8 %
Neutro Abs: 4.4 10*3/uL (ref 1.7–7.7)
Neutrophils Relative %: 65 %
Platelet Count: 352 10*3/uL (ref 150–400)
RBC: 4.64 MIL/uL (ref 3.87–5.11)
RDW: 14.2 % (ref 11.5–15.5)
WBC Count: 6.8 10*3/uL (ref 4.0–10.5)
nRBC: 0 % (ref 0.0–0.2)

## 2019-06-24 LAB — CMP (CANCER CENTER ONLY)
ALT: 10 U/L (ref 0–44)
AST: 13 U/L — ABNORMAL LOW (ref 15–41)
Albumin: 4.1 g/dL (ref 3.5–5.0)
Alkaline Phosphatase: 68 U/L (ref 38–126)
Anion gap: 8 (ref 5–15)
BUN: 12 mg/dL (ref 6–20)
CO2: 27 mmol/L (ref 22–32)
Calcium: 9.2 mg/dL (ref 8.9–10.3)
Chloride: 105 mmol/L (ref 98–111)
Creatinine: 0.79 mg/dL (ref 0.44–1.00)
GFR, Est AFR Am: >60 mL/min (ref >60)
GFR, Estimated: >60 mL/min (ref >60)
Glucose, Bld: 89 mg/dL (ref 70–99)
Potassium: 3.8 mmol/L (ref 3.5–5.1)
Sodium: 140 mmol/L (ref 135–145)
Total Bilirubin: 0.3 mg/dL (ref 0.3–1.2)
Total Protein: 7.6 g/dL (ref 6.5–8.1)

## 2019-06-24 NOTE — Progress Notes (Signed)
REFERRING PROVIDER: Nicholas Lose, MD [address] [place],  [place] [address]  PRIMARY PROVIDER:  Nena Polio, NP  PRIMARY REASON FOR VISIT:  1. Ductal carcinoma in situ (DCIS) of right breast   2. Family history of prostate cancer   3. Family history of breast cancer    I connected with Ms. Boshers on 06/24/2019 at 3:50 PM EDT by Jackquline Denmark and verified that I am speaking with the correct person using two identifiers.    Patient location: [facility] Provider location: office  HISTORY OF PRESENT ILLNESS:   Heather Delacruz, a 38 y.o. female, was seen for a [place] cancer genetics consultation at the request of Dr. Lindi Adie due to a personal and family history of cancer.  Ms. Clauson presents to clinic today to discuss the possibility of a hereditary predisposition to cancer, genetic testing, and to further clarify her future cancer risks, as well as potential cancer risks for family members.   In 2020, at the age of 63, Ms. Pytel was diagnosed with DCIS of the right breast, ER/PR+. The treatment plan includes surgery, adjuvant radiation therapy, and antiestrogen therapy. Patient notes genetic testing will help with a surgical decision.   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      RISK FACTORS:  Menarche was at age 31.  First live birth at age 12.  OCP use for approximately 4 years.  Ovaries intact: yes.  Hysterectomy: no.  Menopausal status: premenopausal.  HRT use: 0 years. Colonoscopy: no; not examined. Mammogram within the last year: yes.   Past Medical History:  Diagnosis Date  .  Family history of breast cancer   . Family history of prostate cancer     Past Surgical History:  Procedure Laterality Date  . ANKLE ARTHROSCOPY    . HIP SURGERY    . TARSAL TUNNEL RELEASE      Social History   Socioeconomic History  . Marital status: Married    Spouse name: Not on file  . Number of children: Not on file  . Years of education: Not on file  . Highest education level: Not on file  Occupational History  . Not on file  Social Needs  . Financial resource strain: Not on file  . Food insecurity    Worry: Not on file    Inability: Not on file  . Transportation needs    Medical: Not on file    Non-medical: Not on file  Tobacco Use  . Smoking status: Never Smoker  . Smokeless tobacco: Never Used  Substance and Sexual Activity  . Alcohol use: Yes    Comment: social  . Drug use: Never  . Sexual activity: Not on file  Lifestyle  . Physical activity    Days per week: Not on file    Minutes per session: Not on file  . Stress: Not on file  Relationships  . Social Herbalist on phone: Not on file    Gets together: Not on file    Attends religious service: Not on file    Active member of club or organization: Not on file  Attends meetings of clubs or organizations: Not on file    Relationship status: Not on file  Other Topics Concern  . Not on file  Social History Narrative  . Not on file     FAMILY HISTORY:  We obtained a detailed, 4-generation family history.  Significant diagnoses are listed below: Family History  Problem Relation Age of Onset  . Prostate cancer Father   . Cervical cancer Sister   . Esophageal cancer Maternal Grandfather   . Breast cancer Paternal Grandmother   . Breast cancer Paternal Aunt        dx 66s  . Prostate cancer Paternal Uncle        dx 4s   Ms. Sandeen has two daughters, ages 95 and 45. Patient has 1 sister who had cervical cancer at 39.   Ms. Kazlauskas mother is living at 41, no history of cancer.  Patient had 3 maternal aunts, 1 maternal uncle, no known cancers. No cancers in her maternal cousins. Her maternal grandmother died of a heart attack, unsure of age. Maternal grandfather died in his 38s and had esophageal cancer.   Ms. Withers father was diagnosed with prostate cancer in his late 78s and is living in his 75s. He had surgery and radiation. Patient has about 4 paternal aunts, 2 paternal uncles, there may be more. One of her aunts had breast cancer in her 69s and is deceased. One of her uncles had prostate cancer in his 39s and died from it. No known cancers in paternal cousins. Her paternal grandmother had breast cancer and is living in her 26s. Her paternal gandfather died in his 74s.  Ms. Kaylor is unaware of previous family history of genetic testing for hereditary cancer risks. There is no reported Ashkenazi Jewish ancestry. There is no known consanguinity.  GENETIC COUNSELING ASSESSMENT: Ms. Schwebke is a 38 y.o. female with a personal and family history which is somewhat suggestive of a hereditary cancer syndrome and predisposition to cancer. We, therefore, discussed and recommended the following at today's visit.   DISCUSSION: We discussed that 5 - 10% of breast cancer is hereditary, with most cases associated with BRCA1/BRCA2 mutations.  There are other genes that can be associated with hereditary breast cancer syndromes, and these genes are associated with other cancers as well such as prostate. We discussed that testing is beneficial for several reasons including surgical decision-making for breast cancer, knowing how to follow individuals after completing their treatment, and understand if other family members could be at risk for cancer and allow them to undergo genetic testing.   We reviewed the characteristics, features and inheritance patterns of hereditary cancer syndromes. We also discussed genetic testing, including the appropriate family members to test, the process  of testing, insurance coverage and turn-around-time for results. We discussed the implications of a negative, positive and/or variant of uncertain significant result. In order to get genetic test results in a timely manner so that Ms. Smethers can use these genetic test results for surgical decisions, we recommended Ms. Besse pursue genetic testing for the [facility] Breast Cancer STAT Panel. Once complete, we recommend Ms. Folk pursue reflex genetic testing to the Common Hereditary Cancers Panel gene panel.   The STAT Breast cancer panel offered by [facility] includes sequencing and rearrangement analysis for the following 9 genes:  ATM, BRCA1, BRCA2, CDH1, CHEK2, PALB2, PTEN, STK11 and TP53.    The Common Hereditary Cancers Panel offered by [facility] includes sequencing and/or deletion duplication testing of the following 48  genes: APC, ATM, AXIN2, BARD1, BMPR1A, BRCA1, BRCA2, BRIP1, CDH1, CDKN2A (p14ARF), CDKN2A (p16INK4a), CKD4, CHEK2, CTNNA1, DICER1, EPCAM (Deletion/duplication testing only), GREM1 (promoter region deletion/duplication testing only), KIT, MEN1, MLH1, MSH2, MSH3, MSH6, MUTYH, NBN, NF1, NHTL1, PALB2, PDGFRA, PMS2, POLD1, POLE, PTEN, RAD50, RAD51C, RAD51D, RNF43, SDHB, SDHC, SDHD, SMAD4, SMARCA4. STK11, TP53, TSC1, TSC2, and VHL.  The following genes were evaluated for sequence changes only: SDHA and HOXB13 c.251G>A variant only.  Based on Ms. Boran's personal and family history of cancer, she meets medical criteria for genetic testing. Despite that she meets criteria, she may still have an out of pocket cost.   PLAN: After considering the risks, benefits, and limitations, Ms. Sardinha provided informed consent to pursue genetic testing and the blood sample was sent to [facility] for analysis of the STAT Panel + Common Hereditary Cancers Panel. Results should be available within approximately 5-12 days' time, at which point they will be disclosed by telephone to Ms. Filler,  as will any additional recommendations warranted by these results. Ms. Zellner will receive a summary of her genetic counseling visit and a copy of her results once available. This information will also be available in Epic.   Ms. Scroggins questions were answered to her satisfaction today. Our contact information was provided should additional questions or concerns arise. Thank you for the referral and allowing [place] to share in the care of your patient.   Faith Rogue, MS, [facility] Genetic Counselor [email or web address] Phone: [phone]  The patient was seen for a total of 15 minutes in virtual genetic counseling. [employer] intern, Amy Danelle Earthly was also present. Drs. Magrinat, Lindi Adie and/or Burr Medico were available for discussion regarding this case.   _______________________________________________________________________ For Office Staff:  Number of people involved in session: 2 Was an Intern/ student involved with case: yes

## 2019-06-24 NOTE — Progress Notes (Signed)
Radiation Oncology         [phone] ________________________________  Name: Heather Delacruz        MRN: [id number]  Date of Service: 06/24/2019 DOB: 1981-07-20  [id number], Tiffany, NP  Jovita Kussmaul, MD     REFERRING PHYSICIAN: Jovita Kussmaul, MD   DIAGNOSIS: The encounter diagnosis was Ductal carcinoma in situ (DCIS) of right breast.   HISTORY OF PRESENT ILLNESS: Heather Delacruz is a 38 y.o. female seen in the multidisciplinary breast clinic for a new diagnosis of right breast cancer. The patient was noted to have screening detected calcifications in the right breast. Diagnostic imaging revealed two groupings of calcifications in the lateral right breast measuring 1.2 x .7 x .4 cm and an 8 x 7 x 5 mm area both within the UOQ. No ultrasound was performed in the axilla. She underwent stereotactic biopsy on 06/18/2019 revealed high-grade DCIS and LCIS arising in a complex sclerosing lesion and adjacent to this another biopsy consistent with high-grade DCIS, and the tumors were ER PR positive.  She comes today to discuss treatment recommendations for her cancer    PREVIOUS RADIATION THERAPY: No   PAST MEDICAL HISTORY: No past medical history on file.     PAST SURGICAL HISTORY: Past Surgical History:  Procedure Laterality Date   ANKLE ARTHROSCOPY     HIP SURGERY     TARSAL TUNNEL RELEASE       FAMILY HISTORY:  Family History  Problem Relation Age of Onset   Prostate cancer Father    Cervical cancer Sister    Throat cancer Maternal Grandfather    Breast cancer Paternal Grandmother    Breast cancer Paternal Aunt    Prostate cancer Paternal Uncle      SOCIAL HISTORY:  reports that she has never smoked. She has never used smokeless tobacco. She reports current alcohol use. She reports that she does not use drugs.  The patient is married and lives in [place].  She has 2 school-aged children.  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  ALLERGIES: Patient has no known allergies.   MEDICATIONS:  No current outpatient medications on file.   No current facility-administered medications for this encounter.      REVIEW OF SYSTEMS: On review of systems, the patient reports that she is doing well overall. She denies any chest pain, shortness of breath, cough, fevers, chills, night sweats, unintended weight changes. She denies any bowel or bladder disturbances, and denies abdominal pain, nausea or vomiting. She denies any new musculoskeletal or joint aches or pains. A complete review of systems is obtained and is otherwise negative.     PHYSICAL EXAM:  Wt Readings from Last 3 Encounters:  06/24/19 198 lb 6.4 oz (90 kg)  03/08/18 187 lb (84.8 kg)  03/11/15 184 lb (83.5 kg)   Temp Readings from Last 3 Encounters:  06/24/19 98.9 F (37.2 C) (Oral)  03/08/18 98.4 F (36.9 C) (Oral)  03/11/15 98.4 F (36.9 C) (Oral)   BP Readings from Last 3 Encounters:  06/24/19 (!) 129/109  03/08/18 121/89  08/18/15 (!) 160/109   Pulse Readings from Last 3 Encounters:  06/24/19 75  03/08/18 80  08/18/15 66     In general this is a well appearing African-American female in no acute distress.  She's alert and oriented x4 and appropriate throughout the examination. Cardiopulmonary assessment is negative for acute distress and she exhibits normal effort.    ECOG = 0  0 - Asymptomatic (Fully active, able to carry on all predisease activities without restriction)  1 - Symptomatic but completely ambulatory (Restricted in physically strenuous activity but ambulatory and able to carry out work of a light or sedentary nature. For example, light housework, office work)  2 - Symptomatic, <50% in bed during the day (Ambulatory and capable of all self care but unable to carry out any work activities. Up and about more than 50% of waking hours)  3 - Symptomatic, >50% in bed, but not bedbound (Capable of only limited  self-care, confined to bed or chair 50% or more of waking hours)  4 - Bedbound (Completely disabled. Cannot carry on any self-care. Totally confined to bed or chair)  5 - Death   Eustace Pen MM, Creech RH, Tormey DC, et al. [phone]). "Toxicity and response criteria of the [facility]". [place] Oncol. 5 (6): 649-55    LABORATORY DATA:  Lab Results  Component Value Date   WBC 6.8 06/24/2019   HGB 13.2 06/24/2019   HCT 39.9 06/24/2019   MCV 86.0 06/24/2019   PLT 352 06/24/2019   Lab Results  Component Value Date   NA 140 06/24/2019   K 3.8 06/24/2019   CL 105 06/24/2019   CO2 27 06/24/2019   Lab Results  Component Value Date   ALT 10 06/24/2019   AST 13 (L) 06/24/2019   ALKPHOS 68 06/24/2019   BILITOT 0.3 06/24/2019      RADIOGRAPHY: Mm Digital Diagnostic Unilat R  Result Date: 06/18/2019 CLINICAL DATA:  The patient returns from baseline screening study for evaluation of RIGHT breast calcifications. EXAM: DIGITAL DIAGNOSTIC RIGHT MAMMOGRAM WITH CAD COMPARISON:  06/09/2019 ACR Breast Density Category c: The breast tissue is heterogeneously dense, which may obscure small masses. FINDINGS: Magnified views are performed of calcifications in the UPPER-OUTER QUADRANT of the RIGHT breast. On magnified views, there are 2 groups of coarse pleomorphic calcifications in the UPPER OUTER QUADRANT of the RIGHT breast. These measure 0.4 x 0.7 x 1.2 centimeters and 0.8 x 0.5 x 0.7 centimeters. IMPRESSION: Indeterminate calcifications in the UPPER OUTER QUADRANT of the RIGHT breast. RECOMMENDATION: Recommend tissue diagnosis of the more LATERAL group of calcifications in the RIGHT breast. I have discussed the findings and recommendations with the patient. Results were also provided in writing at the conclusion of the visit. If applicable, a reminder letter will be sent to the patient regarding the next appointment. BI-RADS CATEGORY  4: Suspicious. Electronically Signed   By:  Nolon Nations M.D.   On: 06/18/2019 09:52   Mm Clip Placement Right  Result Date: 06/18/2019 CLINICAL DATA:  Two groups of microcalcifications in the right breast status post stereotactic guided biopsy. EXAM: DIAGNOSTIC right MAMMOGRAM POST STEREOTACTIC BIOPSY COMPARISON:  Previous exam(s). FINDINGS: Mammographic images were obtained following 2 site stereotactic guided biopsy of 2 groups of microcalcifications in the upper-outer quadrant of the right breast. A coil shaped biopsy marking clip is appropriately positioned at the site of biopsy in the upper outer right breast at posterior depth. An X shaped biopsy marking clip is appropriately positioned at the site of biopsy in the upper outer right breast at middle depth. IMPRESSION: Biopsy marking clips appropriately positioned at the sites of biopsy. Final Assessment: Post Procedure Mammograms for Marker Placement Electronically Signed   By: Zerita Boers M.D.   On: 06/18/2019 13:38   Mm Rt Breast Bx W Loc Dev 1st Lesion Image Bx Spec Stereo Guide  Addendum Date:  06/19/2019   ADDENDUM REPORT: 06/19/2019 13:00 ADDENDUM: Pathology revealed HIGH GRADE DUCTAL CARCINOMA IN SITU AND LOBULAR CARCINOMA IN SITU ARISING IN A COMPLEX SCLEROSING LESION, MICROCALCIFICATION of the RIGHT breast, upper outer quadrant, posterior depth. This was found to be concordant by Dr. Zerita Boers. Pathology revealed HIGH GRADE DUCTAL CARCINOMA IN SITU of the RIGHT breast, upper outer quadrant, middle depth. This was found to be concordant by Dr. Zerita Boers. Pathology results were discussed with the patient by telephone. The patient reported doing well after the biopsy with tenderness at the site. Post biopsy instructions and care were reviewed and questions were answered. The patient was encouraged to call [facility] for any additional concerns. The patient was referred to [facility] on June 24, 2019. Pathology results reported by Stacie Acres, RN on 06/19/2019. Electronically Signed   By: Zerita Boers M.D.   On: 06/19/2019 13:00   Result Date: 06/19/2019 CLINICAL DATA:  Here for biopsy of two groups of pleomorphic microcalcifications in the upper-outer quadrant of the right breast. EXAM: RIGHT BREAST STEREOTACTIC CORE NEEDLE BIOPSY COMPARISON:  Previous exams. FINDINGS: The patient and I discussed the procedure of stereotactic-guided biopsy including benefits and alternatives. We discussed the high likelihood of a successful procedure. We discussed the risks of the procedure including infection, bleeding, tissue injury, clip migration, and inadequate sampling. Informed written consent was given. The usual time out protocol was performed immediately prior to the procedure. Site 1: Using sterile technique and 1% Lidocaine as local anesthetic, under stereotactic guidance, a 9 gauge vacuum assisted device was used to perform core needle biopsy of grouped microcalcifications in the upper-outer quadrant at posterior depth using a superior approach. Specimen radiograph was performed showing representative calcifications. Specimens with calcifications are identified for pathology. Lesion quadrant: Upper outer quadrant At the conclusion of the procedure, a coil shaped tissue marker clip was deployed into the biopsy cavity. Site 2: Using sterile technique and 1% Lidocaine as local anesthetic, under stereotactic guidance, a 9 gauge vacuum assisted device was used to perform core needle biopsy of grouped microcalcifications in the upper-outer quadrant at middle depth using a superior approach. Specimen radiograph was performed showing representative calcifications. Specimens with calcifications are identified for pathology. Lesion quadrant: Upper outer quadrant At the conclusion of the procedure, an X shaped tissue marker clip was deployed into the biopsy cavity. Follow-up 2-view  mammogram was performed and dictated separately. IMPRESSION: Stereotactic-guided biopsies of two groups of microcalcifications in the right breast. No apparent complications. Electronically Signed: By: Zerita Boers M.D. On: 06/18/2019 13:30   Mm Rt Breast Bx W Loc Dev Ea Ad Lesion Img Bx Spec Stereo Guide  Result Date: 06/18/2019 CLINICAL DATA:  Here for biopsy of two groups of pleomorphic microcalcifications in the upper-outer quadrant of the right breast. EXAM: RIGHT BREAST STEREOTACTIC CORE NEEDLE BIOPSY COMPARISON:  Previous exams. FINDINGS: The patient and I discussed the procedure of stereotactic-guided biopsy including benefits and alternatives. We discussed the high likelihood of a successful procedure. We discussed the risks of the procedure including infection, bleeding, tissue injury, clip migration, and inadequate sampling. Informed written consent was given. The usual time out protocol was performed immediately prior to the procedure. Site 1: Using sterile technique and 1% Lidocaine as local anesthetic, under stereotactic guidance, a 9 gauge vacuum assisted device was used to perform core needle biopsy of grouped microcalcifications in the upper outer quadrant at posterior depth  using a superior approach. Specimen radiograph was performed showing representative calcifications. Specimens with calcifications are identified for pathology. Lesion quadrant: Upper outer quadrant At the conclusion of the procedure, a coil shaped tissue marker clip was deployed into the biopsy cavity. Site 2: Using sterile technique and 1% Lidocaine as local anesthetic, under stereotactic guidance, a 9 gauge vacuum assisted device was used to perform core needle biopsy of grouped microcalcifications in the upper outer quadrant at middle depth using a superior approach. Specimen radiograph was performed showing representative calcifications. Specimens with calcifications are identified for pathology. Lesion quadrant: Upper  outer quadrant At the conclusion of the procedure, an X shaped tissue marker clip was deployed into the biopsy cavity. Follow-up 2-view mammogram was performed and dictated separately. IMPRESSION: Stereotactic-guided biopsies of 2 groups of microcalcifications in the right breast. No apparent complications. Electronically Signed   By: Zerita Boers M.D.   On: 06/18/2019 13:54       IMPRESSION/PLAN: 1. High grade, ER/PR positive DCIS of the right breast. Dr. Lisbeth Renshaw discusses the pathology findings and reviews the nature of noninvasive breast disease. The consensus from the breast conference includes breast conservation with lumpectomy. She is also contemplating mastectomy. If she had breast conserving surgery, she would benefit from external radiotherapy to the breast followed by antiestrogen therapy to reduce local and systemic recurrence. We discussed the risks, benefits, short, and long term effects of radiotherapy, and the patient is interested in proceeding. Dr. Lisbeth Renshaw discusses the delivery and logistics of radiotherapy and anticipates a course of 6 1/2 weeks of radiotherapy. We will see her back about 2 weeks after surgery to discuss the simulation process and anticipate we starting radiotherapy about 4-6 weeks after surgery.  2. Possible genetic predisposition to malignancy. The patient is a candidate for genetic testing given her personal and family history. She was offered referral and is interested in proceeding as it could impact her decision making for surgery. 3. Contraceptive counseling. We will review the need for urine Hcg at the time of her next visit depending on her risks of pregnancy.    In a visit lasting 45 minutes, greater than 50% of the time was spent face to face discussing her case, and coordinating the patient's care.  The patient's 2 friends and husband were both able to join [place] on WebEx to discuss her case and listen in on the conversation with Dr. Lisbeth Renshaw as well.  The above  documentation reflects my direct findings during this shared patient visit. Please see the separate note by Dr. Lisbeth Renshaw on this date for the remainder of the patient's plan of care.    Carola Rhine, PAC

## 2019-06-24 NOTE — Assessment & Plan Note (Signed)
06/19/2019:Baseline routine screening mammogram showed two groups of indeterminate calcifications in the UOQ of the right breast, 1.2cm and 0.8cm. 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  Pathology review: I discussed with the patient the difference between DCIS and invasive breast cancer. It is considered a precancerous lesion. DCIS is classified as a 0. It is generally detected through mammograms as calcifications. We discussed the significance of grades and its impact on prognosis. We also discussed the importance of ER and PR receptors and their implications to adjuvant treatment options. Prognosis of DCIS dependence on grade, comedo necrosis. It is anticipated that if not treated, 20-30% of DCIS can develop into invasive breast cancer.  Recommendation: 1. Breast conserving surgery 2. Followed by adjuvant radiation therapy 3. Followed by antiestrogen therapy with tamoxifen 5 years 4.  Genetics consultation  Tamoxifen counseling: We discussed the risks and benefits of tamoxifen. These include but not limited to insomnia, hot flashes, mood changes, vaginal dryness, and weight gain. Although rare, serious side effects including endometrial cancer, risk of blood clots were also discussed. We strongly believe that the benefits far outweigh the risks. Patient understands these risks and consented to starting treatment. Planned treatment duration is 5 years.  Return to clinic after surgery to discuss the final pathology report and come up with an adjuvant treatment plan.

## 2019-06-26 ENCOUNTER — Telehealth: Payer: Self-pay | Admitting: *Deleted

## 2019-06-26 NOTE — Progress Notes (Signed)
[place] Psychosocial Distress Screening Clinical Social Work  Clinical Social Work was referred by distress screening protocol.  The patient scored a 4 on the Psychosocial Distress Thermometer which indicates moderate distress. Clinical Social Worker contacted patient by phone to assess for distress and other psychosocial needs.  Clinical Social Worker follow up needed: No.  I spoke to the patient by phone today.  She stated that she currently has adequate support from family/friends and has joined online/facebook support groups. The patient did note an increase in anxiety related to her cancer diagnosis and stated that she has not found coping mechanisms that work for her at this point. I will e-mail the patient relevant support group information at [facility]. I will reach out to the patient in three weeks for a re-check to inquire about any change in her emotional or mental needs.       [place]  Counseling Intern Voicemail:  [phone]

## 2019-06-26 NOTE — Telephone Encounter (Signed)
Called pt concerning [facility] from 9.2.20. Denies questions or concerns regarding dx or treatment care plan. Pt declined seeing plastics at this time. She wishes to wait until she has her genetic results back. Encourage pt to call with needs. Received verbal understanding.

## 2019-07-01 ENCOUNTER — Encounter: Payer: Self-pay | Admitting: Licensed Clinical Social Worker

## 2019-07-01 ENCOUNTER — Telehealth: Payer: Self-pay | Admitting: Licensed Clinical Social Worker

## 2019-07-01 ENCOUNTER — Ambulatory Visit: Payer: Self-pay | Admitting: Licensed Clinical Social Worker

## 2019-07-01 DIAGNOSIS — Z1379 Encounter for other screening for genetic and chromosomal anomalies: Secondary | ICD-10-CM | POA: Insufficient documentation

## 2019-07-01 DIAGNOSIS — Z803 Family history of malignant neoplasm of breast: Secondary | ICD-10-CM

## 2019-07-01 DIAGNOSIS — Z8042 Family history of malignant neoplasm of prostate: Secondary | ICD-10-CM

## 2019-07-01 DIAGNOSIS — D0511 Intraductal carcinoma in situ of right breast: Secondary | ICD-10-CM

## 2019-07-01 NOTE — Telephone Encounter (Signed)
Revealed negative genetic testing.   We discussed that we do not know why she has breast cancer or why there is cancer in the family. It could be due to a different gene that we are not testing, or something our current technology cannot pick up.  It will be important for her to keep in contact with genetics to learn if additional testing may be needed in the future.  

## 2019-07-01 NOTE — Progress Notes (Signed)
HPI:  Ms. Marcoe was previously seen in the [facility] clinic due to a personal and family history of cancer and concerns regarding a hereditary predisposition to cancer. Please refer to our prior cancer genetics clinic note for more information regarding our discussion, assessment and recommendations, at the time. Ms. Fariss recent genetic test results were disclosed to her, as were recommendations warranted by these results. These results and recommendations are discussed in more detail below.  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    Genetic Testing   Negative genetic testing. No pathogenic variants identified on the [facility] Breast cancer STAT Panel + Common Hereditary Cancers Panel. The STAT Breast cancer panel offered by [facility] includes sequencing and rearrangement analysis for the following 9 genes:  ATM, BRCA1, BRCA2, CDH1, CHEK2, PALB2, PTEN, STK11 and TP53.  The Common Hereditary Cancers Panel offered by [facility] includes sequencing and/or deletion duplication testing of the following 47 genes: APC, ATM, AXIN2, BARD1, BMPR1A, BRCA1, BRCA2, BRIP1, CDH1, CDKN2A (p14ARF), CDKN2A (p16INK4a), CKD4, CHEK2, CTNNA1, DICER1, EPCAM (Deletion/duplication testing only), GREM1 (promoter region deletion/duplication testing only), KIT, MEN1, MLH1, MSH2, MSH3, MSH6, MUTYH, NBN, NF1, NHTL1, PALB2, PDGFRA, PMS2, POLD1, POLE, PTEN, RAD50, RAD51C, RAD51D, SDHB, SDHC, SDHD, SMAD4, SMARCA4. STK11, TP53, TSC1, TSC2, and VHL.  The following genes were evaluated for sequence changes only: SDHA and HOXB13 c.251G>A variant  only. The report date is 06/30/2019.     FAMILY HISTORY:  We obtained a detailed, 4-generation family history.  Significant diagnoses are listed below: Family History  Problem Relation Age of Onset   Prostate cancer Father    Cervical cancer Sister    Esophageal cancer Maternal Grandfather    Breast cancer Paternal Grandmother    Breast cancer Paternal Aunt        dx 6s   Prostate cancer Paternal Uncle        dx 41s   Ms. Battey has two daughters, ages 45 and 55. Patient has 1 sister who had cervical cancer at 59.   Ms. Abrams mother is living at 52, no history of cancer. Patient had 3 maternal aunts, 1 maternal uncle, no known cancers. No cancers in her maternal cousins. Her maternal grandmother died of a heart attack, unsure of age. Maternal grandfather died in his 38s and had esophageal cancer.   Ms. Ruppert father was diagnosed with prostate cancer in his late 14s and is living in his 19s. He had surgery and radiation. Patient has about 4 paternal aunts, 2 paternal uncles, there may be more. One of her aunts had breast cancer in her 75s and is deceased. One of her uncles had prostate cancer in his 49s and died from it. No known cancers in paternal cousins. Her paternal grandmother had breast cancer and is living in her 71s. Her paternal gandfather died in his 1s.  Ms. Ehler is unaware of previous family history of genetic testing for hereditary cancer risks. There is no reported Ashkenazi Jewish ancestry. There is no known consanguinity.  GENETIC TEST RESULTS: Genetic testing reported out on 06/30/2019 through the [facility] Breast Cancer STAT Panel + Common Hereditary cancer  panel found no pathogenic mutations. The STAT Breast cancer panel offered by [facility] includes sequencing and rearrangement analysis for the following 9 genes:  ATM, BRCA1, BRCA2, CDH1, CHEK2, PALB2, PTEN, STK11 and TP53.  The Common Hereditary Cancers Panel offered by [facility] includes sequencing  and/or deletion duplication testing of the following 47 genes: APC, ATM, AXIN2, BARD1, BMPR1A, BRCA1, BRCA2, BRIP1, CDH1, CDKN2A (p14ARF), CDKN2A (p16INK4a), CKD4, CHEK2, CTNNA1, DICER1, EPCAM (Deletion/duplication testing only), GREM1 (promoter region deletion/duplication testing only), KIT, MEN1, MLH1, MSH2, MSH3, MSH6, MUTYH, NBN, NF1, NHTL1, PALB2, PDGFRA, PMS2, POLD1, POLE, PTEN, RAD50, RAD51C, RAD51D,  SDHB, SDHC, SDHD, SMAD4, SMARCA4. STK11, TP53, TSC1, TSC2, and VHL.  The following genes were evaluated for sequence changes only: SDHA and HOXB13 c.251G>A variant only. The test report has been scanned into EPIC and is located under the Molecular Pathology section of the Results Review tab.  A portion of the result report is included below for reference.     We discussed with Ms. Cueva that because current genetic testing is not perfect, it is possible there may be a gene mutation in one of these genes that current testing cannot detect, but that chance is small.  We also discussed, that there could be another gene that has not yet been discovered, or that we have not yet tested, that is responsible for the cancer diagnoses in the family. It is also possible there is a hereditary cause for the cancer in the family that Ms. Mcgloin did not inherit and therefore was not identified in her testing.  Therefore, it is important to remain in touch with cancer genetics in the future so that we can continue to offer Ms. Zemanek the most up to date genetic testing.    ADDITIONAL GENETIC TESTING: We discussed with Ms. Mcferran that her genetic testing was fairly extensive.  If there are genes identified to increase cancer risk that can be analyzed in the future, we would be happy to discuss and coordinate this testing at that time.    CANCER SCREENING RECOMMENDATIONS: Ms. Key test result is considered negative (normal).  This means that we have not identified a hereditary cause for her  personal and  family history of cancer at this time. Most cancers happen by chance and this negative test suggests that her cancer may fall into this category.    While reassuring, this does not definitively rule out a hereditary predisposition to cancer. It is still possible that there could be genetic mutations that are undetectable by current technology. There could be genetic mutations in genes that have not been tested or identified to increase cancer risk.  Therefore, it is recommended she continue to follow the cancer management and screening guidelines provided by her oncology and primary healthcare provider.   An individual's cancer risk and medical management are not determined by genetic test results alone. Overall cancer risk assessment incorporates additional factors, including personal medical history, family history, and any available genetic information that may result in a personalized plan for cancer prevention and surveillance \ RECOMMENDATIONS FOR FAMILY MEMBERS:  Relatives in this family might be at some increased risk of developing cancer, over the general population risk, simply due to the family history of cancer.  We recommended female relatives in this family have a yearly mammogram beginning at age 38, or 35 years younger than the earliest onset of cancer, an annual clinical breast exam, and perform monthly breast self-exams. Female relatives in this family should also have a gynecological exam as  recommended by their primary provider. All family members should have a colonoscopy by age 38, or as directed by their physicians.   It is also possible there is a hereditary cause for the cancer in Ms. Kunz's family that she did not inherit and therefore was not identified in her.  Based on Ms. Miklas's family history, we recommended her paternal relatives have genetic counseling and testing. Ms. Chavira will let us know if we can be of any assistance in coordinating genetic counseling and/or  testing for these family members.  FOLLOW-UP: Lastly, we discussed with Ms. Plyler that cancer genetics is a rapidly advancing field and it is possible that new genetic tests will be appropriate for her and/or her family members in the future. We encouraged her to remain in contact with cancer genetics on an annual basis so we can update her personal and family histories and let her know of advances in cancer genetics that may benefit this family.   Our contact number was provided. Ms. Basso questions were answered to her satisfaction, and she knows she is welcome to call us at anytime with additional questions or concerns.   Faith Rogue, MS, [facility] Genetic Counselor [email or web address] Phone: [phone]

## 2019-07-02 ENCOUNTER — Ambulatory Visit: Payer: Self-pay | Admitting: General Surgery

## 2019-07-02 DIAGNOSIS — D0511 Intraductal carcinoma in situ of right breast: Secondary | ICD-10-CM

## 2019-07-03 ENCOUNTER — Other Ambulatory Visit: Payer: Self-pay | Admitting: General Surgery

## 2019-07-03 ENCOUNTER — Encounter: Payer: Self-pay | Admitting: *Deleted

## 2019-07-03 DIAGNOSIS — D0511 Intraductal carcinoma in situ of right breast: Secondary | ICD-10-CM

## 2019-07-07 ENCOUNTER — Telehealth: Payer: Self-pay | Admitting: Radiation Oncology

## 2019-07-07 ENCOUNTER — Telehealth: Payer: Self-pay | Admitting: Hematology and Oncology

## 2019-07-07 NOTE — Telephone Encounter (Signed)
Scheduled appt per 9/11 sch message - pt aware of appt date and time   

## 2019-07-07 NOTE — Telephone Encounter (Signed)
New message: ° ° °LVM for patient to return call to schedule from referral received. °

## 2019-07-14 ENCOUNTER — Encounter (HOSPITAL_BASED_OUTPATIENT_CLINIC_OR_DEPARTMENT_OTHER): Payer: Self-pay | Admitting: *Deleted

## 2019-07-14 ENCOUNTER — Other Ambulatory Visit: Payer: Self-pay

## 2019-07-14 ENCOUNTER — Telehealth: Payer: Self-pay

## 2019-07-14 NOTE — Telephone Encounter (Signed)
Nutrition Assessment  Reason for Assessment:  Pt attended Breast Clinic on 9/2 and received nutrition packet by nurse navigator  ASSESSMENT:  38 year old female with new diagnosis of breast cancer.  Patient with DCIS of right breast, planning lumpectomy on 9/30 followed by radiation and anitestrogens.  Spoke with patient via phone this pm to introduce myself and service at [facility].  Patient reports normal appetite  Medications:  None listed  Labs: reviewed  Anthropometrics:   Height: 66 inches Weight: 135 lb BMI: 21   NUTRITION DIAGNOSIS: Food and nutrition related knowledge deficit related to new diagnosis of breast cancer as evidenced by no prior need for nutrition related information.  INTERVENTION:   Discussed briefly packet of information regarding nutritional tips for breast cancer patients.  No questions at this time. Contact information provided and patient knows to contact me with questions/concerns.    MONITORING, EVALUATION, and GOAL: Pt will consume a healthy plant based diet to maintain lean body mass throughout treatment.   Heather Delacruz B. Zenia Resides, [place], [place] Registered Dietitian [phone] (pager)

## 2019-07-17 ENCOUNTER — Encounter: Payer: Self-pay | Admitting: *Deleted

## 2019-07-18 ENCOUNTER — Other Ambulatory Visit (HOSPITAL_COMMUNITY)
Admission: RE | Admit: 2019-07-18 | Discharge: 2019-07-18 | Disposition: A | Discharge status: Home / Self Care | Payer: BC Managed Care – PPO | Source: Ambulatory Visit | Attending: General Surgery | Admitting: General Surgery

## 2019-07-18 ENCOUNTER — Other Ambulatory Visit (HOSPITAL_COMMUNITY): Payer: BC Managed Care – PPO

## 2019-07-18 DIAGNOSIS — D0511 Intraductal carcinoma in situ of right breast: Secondary | ICD-10-CM | POA: Diagnosis not present

## 2019-07-18 DIAGNOSIS — Z01812 Encounter for preprocedural laboratory examination: Secondary | ICD-10-CM | POA: Insufficient documentation

## 2019-07-18 DIAGNOSIS — Z20828 Contact with and (suspected) exposure to other viral communicable diseases: Secondary | ICD-10-CM | POA: Insufficient documentation

## 2019-07-19 LAB — NOVEL CORONAVIRUS, NAA (HOSP ORDER, SEND-OUT TO REF LAB; TAT 18-24 HRS): SARS-CoV-2, NAA: NOT DETECTED

## 2019-07-21 ENCOUNTER — Other Ambulatory Visit: Payer: Self-pay

## 2019-07-21 ENCOUNTER — Ambulatory Visit
Admission: RE | Admit: 2019-07-21 | Discharge: 2019-07-21 | Disposition: A | Discharge status: Home / Self Care | Payer: BC Managed Care – PPO | Source: Ambulatory Visit | Attending: General Surgery | Admitting: General Surgery

## 2019-07-21 DIAGNOSIS — D0511 Intraductal carcinoma in situ of right breast: Secondary | ICD-10-CM

## 2019-07-21 NOTE — Progress Notes (Signed)

## 2019-07-22 ENCOUNTER — Encounter (HOSPITAL_BASED_OUTPATIENT_CLINIC_OR_DEPARTMENT_OTHER): Payer: Self-pay | Admitting: Certified Registered"

## 2019-07-22 ENCOUNTER — Ambulatory Visit (HOSPITAL_BASED_OUTPATIENT_CLINIC_OR_DEPARTMENT_OTHER): Payer: BC Managed Care – PPO | Admitting: Certified Registered"

## 2019-07-22 ENCOUNTER — Ambulatory Visit
Admission: RE | Admit: 2019-07-22 | Discharge: 2019-07-22 | Disposition: A | Discharge status: Home / Self Care | Payer: BC Managed Care – PPO | Source: Ambulatory Visit | Attending: General Surgery | Admitting: General Surgery

## 2019-07-22 ENCOUNTER — Encounter (HOSPITAL_BASED_OUTPATIENT_CLINIC_OR_DEPARTMENT_OTHER)
Admission: RE | Disposition: A | Discharge status: Home / Self Care | Payer: Self-pay | Source: Home / Self Care | Attending: General Surgery

## 2019-07-22 ENCOUNTER — Ambulatory Visit (HOSPITAL_BASED_OUTPATIENT_CLINIC_OR_DEPARTMENT_OTHER)
Admission: RE | Admit: 2019-07-22 | Discharge: 2019-07-22 | Disposition: A | Discharge status: Home / Self Care | Payer: BC Managed Care – PPO | Attending: General Surgery | Admitting: General Surgery

## 2019-07-22 ENCOUNTER — Other Ambulatory Visit: Payer: Self-pay

## 2019-07-22 DIAGNOSIS — Z6833 Body mass index (BMI) 33.0-33.9, adult: Secondary | ICD-10-CM | POA: Diagnosis not present

## 2019-07-22 DIAGNOSIS — Z8379 Family history of other diseases of the digestive system: Secondary | ICD-10-CM | POA: Diagnosis not present

## 2019-07-22 DIAGNOSIS — Z8261 Family history of arthritis: Secondary | ICD-10-CM | POA: Diagnosis not present

## 2019-07-22 DIAGNOSIS — Z170 Estrogen receptor positive status [ER+]: Secondary | ICD-10-CM | POA: Insufficient documentation

## 2019-07-22 DIAGNOSIS — Z833 Family history of diabetes mellitus: Secondary | ICD-10-CM | POA: Insufficient documentation

## 2019-07-22 DIAGNOSIS — M1990 Unspecified osteoarthritis, unspecified site: Secondary | ICD-10-CM | POA: Diagnosis not present

## 2019-07-22 DIAGNOSIS — Z8042 Family history of malignant neoplasm of prostate: Secondary | ICD-10-CM | POA: Insufficient documentation

## 2019-07-22 DIAGNOSIS — E669 Obesity, unspecified: Secondary | ICD-10-CM | POA: Insufficient documentation

## 2019-07-22 DIAGNOSIS — Z8249 Family history of ischemic heart disease and other diseases of the circulatory system: Secondary | ICD-10-CM | POA: Diagnosis not present

## 2019-07-22 DIAGNOSIS — D0511 Intraductal carcinoma in situ of right breast: Secondary | ICD-10-CM | POA: Insufficient documentation

## 2019-07-22 DIAGNOSIS — Z803 Family history of malignant neoplasm of breast: Secondary | ICD-10-CM | POA: Diagnosis not present

## 2019-07-22 HISTORY — DX: Malignant (primary) neoplasm, unspecified: C80.1

## 2019-07-22 HISTORY — PX: BREAST LUMPECTOMY WITH RADIOACTIVE SEED LOCALIZATION: SHX6424

## 2019-07-22 LAB — POCT PREGNANCY, URINE: Preg Test, Ur: NEGATIVE

## 2019-07-22 SURGERY — BREAST LUMPECTOMY WITH RADIOACTIVE SEED LOCALIZATION
Anesthesia: General | Site: Breast | Laterality: Right

## 2019-07-22 MED ORDER — MIDAZOLAM HCL 2 MG/2ML IJ SOLN
INTRAMUSCULAR | Status: AC
  Filled 2019-07-22: qty 2

## 2019-07-22 MED ORDER — CEFAZOLIN SODIUM-DEXTROSE 2-4 GM/100ML-% IV SOLN
2.0000 g | INTRAVENOUS | Status: AC
  Administered 2019-07-22: 2 g via INTRAVENOUS

## 2019-07-22 MED ORDER — HYDROMORPHONE HCL 1 MG/ML IJ SOLN
INTRAMUSCULAR | Status: AC
  Filled 2019-07-22: qty 0.5

## 2019-07-22 MED ORDER — DEXAMETHASONE SODIUM PHOSPHATE 4 MG/ML IJ SOLN
INTRAMUSCULAR | Status: DC | PRN
  Administered 2019-07-22: 10 mg via INTRAVENOUS

## 2019-07-22 MED ORDER — HYDROCODONE-ACETAMINOPHEN 5-325 MG PO TABS: 1.0000 | ORAL_TABLET | Freq: Four times a day (QID) | ORAL | 0 refills | Status: DC | PRN

## 2019-07-22 MED ORDER — PROPOFOL 500 MG/50ML IV EMUL
INTRAVENOUS | Status: DC | PRN
  Administered 2019-07-22: 25 ug/kg/min via INTRAVENOUS

## 2019-07-22 MED ORDER — ACETAMINOPHEN 500 MG PO TABS
ORAL_TABLET | ORAL | Status: AC
  Filled 2019-07-22: qty 2

## 2019-07-22 MED ORDER — HYDROMORPHONE HCL 1 MG/ML IJ SOLN
0.2500 mg | INTRAMUSCULAR | Status: DC | PRN
  Administered 2019-07-22: 0.5 mg via INTRAVENOUS

## 2019-07-22 MED ORDER — CEFAZOLIN SODIUM-DEXTROSE 2-4 GM/100ML-% IV SOLN
INTRAVENOUS | Status: AC
  Filled 2019-07-22: qty 100

## 2019-07-22 MED ORDER — CELECOXIB 200 MG PO CAPS
ORAL_CAPSULE | ORAL | Status: AC
  Filled 2019-07-22: qty 1

## 2019-07-22 MED ORDER — LIDOCAINE HCL (CARDIAC) PF 100 MG/5ML IV SOSY
PREFILLED_SYRINGE | INTRAVENOUS | Status: DC | PRN
  Administered 2019-07-22: 60 mg via INTRAVENOUS

## 2019-07-22 MED ORDER — GABAPENTIN 300 MG PO CAPS
ORAL_CAPSULE | ORAL | Status: AC
  Filled 2019-07-22: qty 1

## 2019-07-22 MED ORDER — OXYCODONE HCL 5 MG/5ML PO SOLN: 5.0000 mg | Freq: Once | ORAL | Status: DC | PRN

## 2019-07-22 MED ORDER — ACETAMINOPHEN 500 MG PO TABS
1000.0000 mg | ORAL_TABLET | ORAL | Status: AC
  Administered 2019-07-22: 1000 mg via ORAL

## 2019-07-22 MED ORDER — MIDAZOLAM HCL 2 MG/2ML IJ SOLN
1.0000 mg | INTRAMUSCULAR | Status: DC | PRN
  Administered 2019-07-22: 2 mg via INTRAVENOUS

## 2019-07-22 MED ORDER — EPHEDRINE SULFATE 50 MG/ML IJ SOLN
INTRAMUSCULAR | Status: DC | PRN
  Administered 2019-07-22 (×3): 10 mg via INTRAVENOUS

## 2019-07-22 MED ORDER — FENTANYL CITRATE (PF) 100 MCG/2ML IJ SOLN
INTRAMUSCULAR | Status: AC
  Filled 2019-07-22: qty 2

## 2019-07-22 MED ORDER — PROMETHAZINE HCL 25 MG/ML IJ SOLN
INTRAMUSCULAR | Status: AC
  Filled 2019-07-22: qty 1

## 2019-07-22 MED ORDER — OXYCODONE HCL 5 MG PO TABS: 5.0000 mg | ORAL_TABLET | Freq: Once | ORAL | Status: DC | PRN

## 2019-07-22 MED ORDER — GABAPENTIN 300 MG PO CAPS
300.0000 mg | ORAL_CAPSULE | ORAL | Status: AC
  Administered 2019-07-22: 300 mg via ORAL

## 2019-07-22 MED ORDER — EPHEDRINE 5 MG/ML INJ
INTRAVENOUS | Status: AC
  Filled 2019-07-22: qty 30

## 2019-07-22 MED ORDER — PROPOFOL 10 MG/ML IV BOLUS
INTRAVENOUS | Status: DC | PRN
  Administered 2019-07-22: 200 mg via INTRAVENOUS

## 2019-07-22 MED ORDER — FENTANYL CITRATE (PF) 100 MCG/2ML IJ SOLN
50.0000 ug | INTRAMUSCULAR | Status: DC | PRN
  Administered 2019-07-22 (×2): 50 ug via INTRAVENOUS

## 2019-07-22 MED ORDER — CELECOXIB 200 MG PO CAPS
200.0000 mg | ORAL_CAPSULE | ORAL | Status: AC
  Administered 2019-07-22: 200 mg via ORAL

## 2019-07-22 MED ORDER — CHLORHEXIDINE GLUCONATE CLOTH 2 % EX PADS: 6.0000 | MEDICATED_PAD | Freq: Once | CUTANEOUS | Status: DC

## 2019-07-22 MED ORDER — BUPIVACAINE-EPINEPHRINE 0.5% -1:200000 IJ SOLN
INTRAMUSCULAR | Status: DC | PRN
  Administered 2019-07-22: 20 mL

## 2019-07-22 MED ORDER — PROMETHAZINE HCL 25 MG/ML IJ SOLN
6.2500 mg | INTRAMUSCULAR | Status: DC | PRN
  Administered 2019-07-22: 6.25 mg via INTRAVENOUS

## 2019-07-22 MED ORDER — ONDANSETRON HCL 4 MG/2ML IJ SOLN
INTRAMUSCULAR | Status: DC | PRN
  Administered 2019-07-22: 4 mg via INTRAVENOUS

## 2019-07-22 MED ORDER — LACTATED RINGERS IV SOLN
INTRAVENOUS | Status: DC
  Administered 2019-07-22: 08:00:00 via INTRAVENOUS

## 2019-07-22 SURGICAL SUPPLY — 43 items
APPLIER CLIP 9.375 MED OPEN (MISCELLANEOUS) ×2
BINDER BREAST XLRG (GAUZE/BANDAGES/DRESSINGS) ×2 IMPLANT
BLADE SURG 15 STRL LF DISP TIS (BLADE) ×1 IMPLANT
BLADE SURG 15 STRL SS (BLADE) ×1
CANISTER SUC SOCK COL 7IN (MISCELLANEOUS) ×2 IMPLANT
CANISTER SUCT 1200ML W/VALVE (MISCELLANEOUS) ×2 IMPLANT
CHLORAPREP W/TINT 26 (MISCELLANEOUS) ×2 IMPLANT
CLIP APPLIE 9.375 MED OPEN (MISCELLANEOUS) ×1 IMPLANT
COVER BACK TABLE REUSABLE LG (DRAPES) ×2 IMPLANT
COVER MAYO STAND REUSABLE (DRAPES) ×2 IMPLANT
COVER PROBE W GEL 5X96 (DRAPES) ×2 IMPLANT
COVER WAND RF STERILE (DRAPES) IMPLANT
DECANTER SPIKE VIAL GLASS SM (MISCELLANEOUS) IMPLANT
DERMABOND ADVANCED (GAUZE/BANDAGES/DRESSINGS) ×1
DERMABOND ADVANCED .7 DNX12 (GAUZE/BANDAGES/DRESSINGS) ×1 IMPLANT
DRAPE LAPAROSCOPIC ABDOMINAL (DRAPES) ×2 IMPLANT
DRAPE UTILITY XL STRL (DRAPES) ×2 IMPLANT
ELECT COATED BLADE 2.86 ST (ELECTRODE) ×2 IMPLANT
ELECT REM PT RETURN 9FT ADLT (ELECTROSURGICAL) ×2
ELECTRODE REM PT RTRN 9FT ADLT (ELECTROSURGICAL) ×1 IMPLANT
GLOVE BIO SURGEON STRL SZ 6.5 (GLOVE) ×2 IMPLANT
GLOVE BIO SURGEON STRL SZ7.5 (GLOVE) ×4 IMPLANT
GLOVE BIOGEL PI IND STRL 6.5 (GLOVE) ×1 IMPLANT
GLOVE BIOGEL PI INDICATOR 6.5 (GLOVE) ×1
GOWN STRL REUS W/ TWL LRG LVL3 (GOWN DISPOSABLE) ×2 IMPLANT
GOWN STRL REUS W/TWL LRG LVL3 (GOWN DISPOSABLE) ×2
ILLUMINATOR WAVEGUIDE N/F (MISCELLANEOUS) IMPLANT
KIT MARKER MARGIN INK (KITS) ×2 IMPLANT
LIGHT WAVEGUIDE WIDE FLAT (MISCELLANEOUS) IMPLANT
NEEDLE HYPO 25X1 1.5 SAFETY (NEEDLE) IMPLANT
NS IRRIG 1000ML POUR BTL (IV SOLUTION) ×2 IMPLANT
PACK BASIN DAY SURGERY FS (CUSTOM PROCEDURE TRAY) ×2 IMPLANT
PENCIL BUTTON HOLSTER BLD 10FT (ELECTRODE) ×2 IMPLANT
SLEEVE SCD COMPRESS KNEE MED (MISCELLANEOUS) ×2 IMPLANT
SPONGE LAP 18X18 RF (DISPOSABLE) ×2 IMPLANT
SUT MON AB 4-0 PC3 18 (SUTURE) ×2 IMPLANT
SUT SILK 2 0 SH (SUTURE) IMPLANT
SUT VICRYL 3-0 CR8 SH (SUTURE) ×2 IMPLANT
SYR CONTROL 10ML LL (SYRINGE) ×2 IMPLANT
TOWEL GREEN STERILE FF (TOWEL DISPOSABLE) ×2 IMPLANT
TRAY FAXITRON CT DISP (TRAY / TRAY PROCEDURE) ×2 IMPLANT
TUBE CONNECTING 20X1/4 (TUBING) ×2 IMPLANT
YANKAUER SUCT BULB TIP NO VENT (SUCTIONS) ×2 IMPLANT

## 2019-07-22 NOTE — Anesthesia Postprocedure Evaluation (Signed)
Anesthesia Post Note  Patient: Heather Delacruz  Procedure(s) Performed: RIGHT BREAST LUMPECTOMY WITH BRACKETED RADIOACTIVE SEED LOCALIZATION (Right Breast)     Patient location during evaluation: PACU Anesthesia Type: General Level of consciousness: awake and sedated Pain management: pain level controlled Vital Signs Assessment: post-procedure vital signs reviewed and stable Respiratory status: spontaneous breathing Cardiovascular status: stable Postop Assessment: no apparent nausea or vomiting Anesthetic complications: no    Last Vitals:  Vitals:   07/22/19 1145 07/22/19 1200  BP: (!) 144/88 (!) 145/86  Pulse: 94 93  Resp: 15 16  Temp:    SpO2: 100% 100%    Last Pain:  Vitals:   07/22/19 1200  TempSrc:   PainSc: 4    Pain Goal: Patients Stated Pain Goal: 3 (07/22/19 1200)                 Huston Foley

## 2019-07-22 NOTE — Interval H&P Note (Signed)
History and Physical Interval Note:  07/22/2019 9:40 AM  Heather Delacruz  has presented today for surgery, with the diagnosis of RIGHT BREAST DCIS.  The various methods of treatment have been discussed with the patient and family. After consideration of risks, benefits and other options for treatment, the patient has consented to  Procedure(s): RIGHT BREAST LUMPECTOMY WITH BRACKETED RADIOACTIVE SEED LOCALIZATION (Right) as a surgical intervention.  The patient's history has been reviewed, patient examined, no change in status, stable for surgery.  I have reviewed the patient's chart and labs.  Questions were answered to the patient's satisfaction.     [name]

## 2019-07-22 NOTE — Discharge Instructions (Signed)
No Tylenol or Ibuprofen until 3:00 pm if needed   Post Anesthesia Home Care Instructions  Activity: Get plenty of rest for the remainder of the day. A responsible individual must stay with you for 24 hours following the procedure.  For the next 24 hours, DO NOT: -Drive a car -Paediatric nurse -Drink alcoholic beverages -Take any medication unless instructed by your physician -Make any legal decisions or sign important papers.  Meals: Start with liquid foods such as gelatin or soup. Progress to regular foods as tolerated. Avoid greasy, spicy, heavy foods. If nausea and/or vomiting occur, drink only clear liquids until the nausea and/or vomiting subsides. Call your physician if vomiting continues.  Special Instructions/Symptoms: Your throat may feel dry or sore from the anesthesia or the breathing tube placed in your throat during surgery. If this causes discomfort, gargle with warm salt water. The discomfort should disappear within 24 hours.  If you had a scopolamine patch placed behind your ear for the management of post- operative nausea and/or vomiting:  1. The medication in the patch is effective for 72 hours, after which it should be removed.  Wrap patch in a tissue and discard in the trash. Wash hands thoroughly with soap and water. 2. You may remove the patch earlier than 72 hours if you experience unpleasant side effects which may include dry mouth, dizziness or visual disturbances. 3. Avoid touching the patch. Wash your hands with soap and water after contact with the patch.

## 2019-07-22 NOTE — H&P (Signed)
Heather Delacruz  Location: [facility] Patient #: [id number] DOB: 1981/02/19 Married / Language: English / Race: Black or African American Female   History of Present Illness  The patient is a 38 year old female who presents with breast cancer.We are asked to see the patient in consultation by Dr. Lindi Adie to evaluate her for a new right breast cancer. The patient is a 38 year old black female who presents with calcs in the UOQ of right breast on her first screening mammogram. There were 2 small areas that measured 1.2cm and 29mm and were 2.6cm apart. They were biopsied and came back as high grade DCIS with LCIS that was ER and PR +. She does not smoke but does have some family hx of breast cancer on her dad's side.   Past Surgical History  Breast Biopsy  Right. Oral Surgery  Shoulder Surgery  Right.  Diagnostic Studies History Colonoscopy  never Mammogram  within last year Pap Smear  1-5 years ago  Social History  Alcohol use  Occasional alcohol use. Caffeine use  Carbonated beverages. No drug use  Tobacco use  Never smoker.  Family History  Arthritis  Father, Mother. Breast Cancer  Family Members In General. Depression  Mother, Sister. Diabetes Mellitus  Father, Sister. Hypertension  Father, Mother, Sister. Ischemic Bowel Disease  Sister. Prostate Cancer  Father.  Pregnancy / Birth History Age at menarche  55 years. Contraceptive History  Intrauterine device. Gravida  2 Length (months) of breastfeeding  7-12 Maternal age  28-30 Para  2 Regular periods   Other Problems  Arthritis  General anesthesia - complications     Review of Systems General Not Present- Appetite Loss, Chills, Fatigue, Fever, Night Sweats, Weight Gain and Weight Loss. Skin Not Present- Change in Wart/Mole, Dryness, Hives, Jaundice, New Lesions, Non-Healing Wounds, Rash and Ulcer. HEENT Not Present- Earache, Hearing Loss, Hoarseness, Nose Bleed, Oral Ulcers,  Ringing in the Ears, Seasonal Allergies, Sinus Pain, Sore Throat, Visual Disturbances, Wears glasses/contact lenses and Yellow Eyes. Respiratory Not Present- Bloody sputum, Chronic Cough, Difficulty Breathing, Snoring and Wheezing. Breast Not Present- Breast Mass, Breast Pain, Nipple Discharge and Skin Changes. Cardiovascular Not Present- Chest Pain, Difficulty Breathing Lying Down, Leg Cramps, Palpitations, Rapid Heart Rate, Shortness of Breath and Swelling of Extremities. Gastrointestinal Not Present- Abdominal Pain, Bloating, Bloody Stool, Change in Bowel Habits, Chronic diarrhea, Constipation, Difficulty Swallowing, Excessive gas, Gets full quickly at meals, Hemorrhoids, Indigestion, Nausea, Rectal Pain and Vomiting. Female Genitourinary Not Present- Frequency, Nocturia, Painful Urination, Pelvic Pain and Urgency. Musculoskeletal Not Present- Back Pain, Joint Pain, Joint Stiffness, Muscle Pain, Muscle Weakness and Swelling of Extremities. Neurological Not Present- Decreased Memory, Fainting, Headaches, Numbness, Seizures, Tingling, Tremor, Trouble walking and Weakness. Psychiatric Not Present- Anxiety, Bipolar, Change in Sleep Pattern, Depression, Fearful and Frequent crying. Endocrine Not Present- Cold Intolerance, Excessive Hunger, Hair Changes, Heat Intolerance, Hot flashes and New Diabetes. Hematology Not Present- Blood Thinners, Easy Bruising, Excessive bleeding, Gland problems, HIV and Persistent Infections.   Physical Exam General Mental Status-Alert. General Appearance-Consistent with stated age. Hydration-Well hydrated. Voice-Normal.  Head and Neck Head-normocephalic, atraumatic with no lesions or palpable masses. Trachea-midline. Thyroid Gland Characteristics - normal size and consistency.  Eye Eyeball - Bilateral-Extraocular movements intact. Sclera/Conjunctiva - Bilateral-No scleral icterus.  Chest and Lung Exam Chest and lung exam reveals -quiet, even  and easy respiratory effort with no use of accessory muscles and on auscultation, normal breath sounds, no adventitious sounds and normal vocal resonance. Inspection Chest Wall -  Normal. Back - normal.  Breast Note: There is a palpable bruise in the UOQ of right breast. other than this there is no palpable mass in either breast. There is no palpable axillary, supraclavicular, or cervical lymphadenopathy   Cardiovascular Cardiovascular examination reveals -normal heart sounds, regular rate and rhythm with no murmurs and normal pedal pulses bilaterally.  Abdomen Inspection Inspection of the abdomen reveals - No Hernias. Skin - Scar - no surgical scars. Palpation/Percussion Palpation and Percussion of the abdomen reveal - Soft, Non Tender, No Rebound tenderness, No Rigidity (guarding) and No hepatosplenomegaly. Auscultation Auscultation of the abdomen reveals - Bowel sounds normal.  Neurologic Neurologic evaluation reveals -alert and oriented x 3 with no impairment of recent or remote memory. Mental Status-Normal.  Musculoskeletal Normal Exam - Left-Upper Extremity Strength Normal and Lower Extremity Strength Normal. Normal Exam - Right-Upper Extremity Strength Normal and Lower Extremity Strength Normal.  Lymphatic Head & Neck  General Head & Neck Lymphatics: Bilateral - Description - Normal. Axillary  General Axillary Region: Bilateral - Description - Normal. Tenderness - Non Tender. Femoral & Inguinal  Generalized Femoral & Inguinal Lymphatics: Bilateral - Description - Normal. Tenderness - Non Tender.    Assessment & Plan   DUCTAL CARCINOMA IN SITU (DCIS) OF RIGHT BREAST (D05.11) Impression: The patient appears to have 2 small areas of dcis in the UOQ of right breast. I have talked to her about the different options for treatment. Her plan is that if her genetics are neg then she would want right breast rsl lumpectomy. if her genetics are positive then she wants  bilateral mastectomy with right sln bx. I have discussed with her the risks and benefits of the surgery as well as some of the technical aspects including the use of a seed and she understands and wishes to proceed. If she chooses mastectomy then she may need Plastics consult

## 2019-07-22 NOTE — Anesthesia Preprocedure Evaluation (Addendum)
Anesthesia Evaluation  Patient identified by MRN, date of birth, ID band Patient awake    Reviewed: Allergy & Precautions, NPO status , Patient's Chart, lab work & pertinent test results  Airway Mallampati: II  TM Distance: >3 FB Neck ROM: Full    Dental no notable dental hx.    Pulmonary neg pulmonary ROS,    Pulmonary exam normal breath sounds clear to auscultation       Cardiovascular negative cardio ROS Normal cardiovascular exam Rhythm:Regular Rate:Normal     Neuro/Psych negative neurological ROS  negative psych ROS   GI/Hepatic negative GI ROS, Neg liver ROS,   Endo/Other  negative endocrine ROS  Renal/GU negative Renal ROS     Musculoskeletal negative musculoskeletal ROS (+)   Abdominal (+) + obese,   Peds  Hematology negative hematology ROS (+)   Anesthesia Other Findings RIGHT BREAST DCIS  Reproductive/Obstetrics                            Anesthesia Physical Anesthesia Plan  ASA: II  Anesthesia Plan: General   Post-op Pain Management:    Induction: Intravenous  PONV Risk Score and Plan: 3 and Ondansetron, Dexamethasone, Midazolam and Treatment may vary due to age or medical condition  Airway Management Planned: LMA  Additional Equipment:   Intra-op Plan:   Post-operative Plan: Extubation in OR  Informed Consent: I have reviewed the patients History and Physical, chart, labs and discussed the procedure including the risks, benefits and alternatives for the proposed anesthesia with the patient or authorized representative who has indicated his/her understanding and acceptance.     Dental advisory given  Plan Discussed with: CRNA  Anesthesia Plan Comments:        Anesthesia Quick Evaluation

## 2019-07-22 NOTE — Anesthesia Procedure Notes (Signed)
Procedure Name: LMA Insertion Date/Time: 07/22/2019 9:55 AM Performed by: Signe Colt, CRNA Pre-anesthesia Checklist: Patient identified, Emergency Drugs available, Suction available and Patient being monitored Patient Re-evaluated:Patient Re-evaluated prior to induction Oxygen Delivery Method: Circle system utilized Preoxygenation: Pre-oxygenation with 100% oxygen Induction Type: IV induction Ventilation: Mask ventilation without difficulty LMA: LMA inserted LMA Size: 4.0 Number of attempts: 1 Airway Equipment and Method: Bite block Placement Confirmation: positive ETCO2 Tube secured with: Tape Dental Injury: Teeth and Oropharynx as per pre-operative assessment

## 2019-07-22 NOTE — Op Note (Signed)
07/22/2019  11:04 AM  PATIENT:  Heather Delacruz  38 y.o. female  PRE-OPERATIVE DIAGNOSIS:  RIGHT BREAST DCIS  POST-OPERATIVE DIAGNOSIS:  RIGHT BREAST DCIS  PROCEDURE:  Procedure(s): RIGHT BREAST LUMPECTOMY WITH BRACKETED RADIOACTIVE SEED LOCALIZATION (Right)  SURGEON:  Surgeon(s) and Role:    * Jovita Kussmaul, MD - Primary  PHYSICIAN ASSISTANT:   ASSISTANTS: none   ANESTHESIA:   local and general  EBL:  Minimal   BLOOD ADMINISTERED:none  DRAINS: none   LOCAL MEDICATIONS USED:  MARCAINE     SPECIMEN:  Source of Specimen:  right breast tissue  DISPOSITION OF SPECIMEN:  PATHOLOGY  COUNTS:  YES  TOURNIQUET:  * No tourniquets in log *  DICTATION: .Dragon Dictation   After informed consent was obtained the patient was brought to the operating room and placed in the supine position on the operating table.  After adequate induction of general anesthesia the patient's right breast was prepped with ChloraPrep, allowed to dry, and draped in usual sterile manner.  An appropriate timeout was performed.  Previously to I-125 seeds were placed in the upper outer aspect of the right breast to bracket 2 small areas of ductal carcinoma in situ.  The neoprobe was set to I-125 in the area of radioactivity was readily identified.  The area around this was infiltrated with quarter percent Marcaine.  A curvilinear incision was made along the edge of the areola in the upper outer right breast with a 15 blade knife.  The incision was carried through the skin and subcutaneous tissue sharply with the electrocautery.  Dissection was then carried out between the breast tissue and the subcutaneous fat throughout the right upper outer quadrant.  The dissection was then carried down towards both radioactive seeds sharply with the electrocautery and as we approached the 2 seeds we then removed a large circle of breast tissue around the 2 radioactive seeds while checking the area of radioactivity frequently.   This dissection was carried all the way to the chest wall posteriorly.  Once the specimen was removed we oriented it with the appropriate paint colors.  A specimen radiograph was obtained that showed both clips and seeds to be within the center of the specimen.  The specimen was then sent to pathology for further evaluation.  Hemostasis was achieved using the Bovie electrocautery.  The wound was irrigated with saline and infiltrated with more quarter percent Marcaine.  The cavity was marked with clips.  The deep layer of the wound was then closed with layers of interrupted 3-0 Vicryl stitches.  The skin was closed with interrupted 4-0 Monocryl subcuticular stitches.  Dermabond dressings were applied.  The patient tolerated the procedure well.  At the end of the case all needle sponge and instrument counts were correct.  The patient was then awakened and taken to recovery in stable condition.  PLAN OF CARE: Discharge to home after PACU  PATIENT DISPOSITION:  PACU - hemodynamically stable.   Delay start of Pharmacological VTE agent (>24hrs) due to surgical blood loss or risk of bleeding: not applicable

## 2019-07-22 NOTE — Transfer of Care (Signed)
Immediate Anesthesia Transfer of Care Note  Patient: Heather Delacruz  Procedure(s) Performed: RIGHT BREAST LUMPECTOMY WITH BRACKETED RADIOACTIVE SEED LOCALIZATION (Right Breast)  Patient Location: PACU  Anesthesia Type:General  Level of Consciousness: drowsy and patient cooperative  Airway & Oxygen Therapy: Patient Spontanous Breathing and Patient connected to face mask oxygen  Post-op Assessment: Report given to RN and Post -op Vital signs reviewed and stable  Post vital signs: Reviewed and stable  Last Vitals:  Vitals Value Taken Time  BP    Temp    Pulse    Resp    SpO2      Last Pain:  Vitals:   07/22/19 0820  TempSrc: Oral  PainSc: 0-No pain      Patients Stated Pain Goal: 4 ([id number])  Complications: No apparent anesthesia complications

## 2019-07-23 ENCOUNTER — Encounter (HOSPITAL_BASED_OUTPATIENT_CLINIC_OR_DEPARTMENT_OTHER): Payer: Self-pay | Admitting: General Surgery

## 2019-07-24 LAB — SURGICAL PATHOLOGY

## 2019-07-27 ENCOUNTER — Encounter: Payer: Self-pay | Admitting: *Deleted

## 2019-07-29 NOTE — Progress Notes (Signed)
Patient Care Team: Nena Polio, NP as PCP - General (Family Medicine) Mauro Kaufmann, RN as Oncology Nurse Navigator Rockwell Germany, RN as Oncology Nurse Navigator Jovita Kussmaul, MD as Consulting Physician (General Surgery) Nicholas Lose, MD as Consulting Physician (Hematology and Oncology) Kyung Rudd, MD as Consulting Physician (Radiation Oncology)  DIAGNOSIS:    ICD-10-CM   1. Ductal carcinoma in situ (DCIS) of right breast  D05.11     SUMMARY OF ONCOLOGIC HISTORY: Oncology History  Ductal carcinoma in situ (DCIS) of right breast  06/19/2019 Initial Diagnosis   Baseline routine screening mammogram showed two groups of indeterminate calcifications in the UOQ of the right breast, 1.2cm and 0.8cm. 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    Genetic Testing   Negative genetic testing. No pathogenic variants identified on the [facility] Breast cancer STAT Panel + Common Hereditary Cancers Panel. The STAT Breast cancer panel offered by [facility] includes sequencing and rearrangement analysis for the following 9 genes:  ATM, BRCA1, BRCA2, CDH1, CHEK2, PALB2, PTEN, STK11 and TP53.  The Common Hereditary Cancers Panel offered by [facility] includes sequencing and/or deletion duplication testing of the following 47 genes: APC, ATM, AXIN2, BARD1, BMPR1A, BRCA1, BRCA2, BRIP1, CDH1, CDKN2A (p14ARF), CDKN2A (p16INK4a), CKD4, CHEK2, CTNNA1, DICER1, EPCAM (Deletion/duplication testing only), GREM1 (promoter region deletion/duplication testing only), KIT, MEN1, MLH1, MSH2, MSH3, MSH6, MUTYH, NBN, NF1, NHTL1, PALB2, PDGFRA, PMS2, POLD1, POLE, PTEN, RAD50, RAD51C, RAD51D, SDHB, SDHC, SDHD, SMAD4, SMARCA4. STK11, TP53, TSC1, TSC2, and VHL.  The following genes were evaluated for sequence changes only: SDHA and HOXB13 c.251G>A variant only. The report date is 06/30/2019.    07/22/2019 Surgery   Right lumpectomy Marlou Starks): 5.2 cm high grade DCIS, clear margins.  Complex sclerosing lesion ER 90%, PR 100%     CHIEF COMPLIANT: Follow-up s/p lumpectomy to review pathology  INTERVAL HISTORY: Heather Delacruz is a 38 y.o. with above-mentioned history of right breast DCIS. She underwent a lumpectomy on 07/22/19 with Dr. Marlou Starks for which pathology confirmed high grade DCIS, clear margins. She presents to the clinic today to discuss the pathology report and further treatment.   REVIEW OF SYSTEMS:   Constitutional: Denies fevers, chills or abnormal weight loss Eyes: Denies blurriness of vision Ears, nose, mouth, throat, and face: Denies mucositis or sore throat Respiratory: Denies cough, dyspnea or wheezes Cardiovascular: Denies palpitation, chest discomfort Gastrointestinal: Denies nausea, heartburn or change in bowel habits Skin: Denies abnormal skin rashes Lymphatics: Denies new lymphadenopathy or easy bruising Neurological: Denies numbness, tingling or new weaknesses Behavioral/Psych: Mood is stable, no new changes  Extremities: No lower extremity edema Breast: denies any pain or lumps or nodules in either breasts All other systems were reviewed with the patient and are negative.  I have reviewed the past medical history, past surgical history, social history and family history with the patient and they are unchanged from previous note.  ALLERGIES:  has No Known Allergies.  MEDICATIONS:  Current Outpatient Medications  Medication Sig Dispense Refill  . HYDROcodone-acetaminophen (NORCO/VICODIN) 5-325 MG tablet Take 1-2 tablets by mouth every 6 (six) hours as needed for moderate pain or severe pain. 15 tablet 0   No current facility-administered medications for this visit.     PHYSICAL EXAMINATION: ECOG PERFORMANCE STATUS: 1 - Symptomatic but completely ambulatory  Vitals:   07/30/19 1347  BP: (!) 129/95  Pulse:  73  Resp: 18  Temp: 99.1 F (37.3 C)  SpO2:  100%   Filed Weights   07/30/19 1347  Weight: 202 lb 3.2 oz (91.7 kg)    GENERAL: alert, no distress and comfortable SKIN: skin color, texture, turgor are normal, no rashes or significant lesions EYES: normal, Conjunctiva are pink and non-injected, sclera clear OROPHARYNX: no exudate, no erythema and lips, buccal mucosa, and tongue normal  NECK: supple, thyroid normal size, non-tender, without nodularity LYMPH: no palpable lymphadenopathy in the cervical, axillary or inguinal LUNGS: clear to auscultation and percussion with normal breathing effort HEART: regular rate & rhythm and no murmurs and no lower extremity edema ABDOMEN: abdomen soft, non-tender and normal bowel sounds MUSCULOSKELETAL: no cyanosis of digits and no clubbing  NEURO: alert & oriented x 3 with fluent speech, no focal motor/sensory deficits EXTREMITIES: No lower extremity edema  LABORATORY DATA:  I have reviewed the data as listed CMP Latest Ref Rng & Units 06/24/2019  Glucose 70 - 99 mg/dL 89  BUN 6 - 20 mg/dL 12  Creatinine 0.44 - 1.00 mg/dL 0.79  Sodium 135 - 145 mmol/L 140  Potassium 3.5 - 5.1 mmol/L 3.8  Chloride 98 - 111 mmol/L 105  CO2 22 - 32 mmol/L 27  Calcium 8.9 - 10.3 mg/dL 9.2  Total Protein 6.5 - 8.1 g/dL 7.6  Total Bilirubin 0.3 - 1.2 mg/dL 0.3  Alkaline Phos 38 - 126 U/L 68  AST 15 - 41 U/L 13(L)  ALT 0 - 44 U/L 10    Lab Results  Component Value Date   WBC 6.8 06/24/2019   HGB 13.2 06/24/2019   HCT 39.9 06/24/2019   MCV 86.0 06/24/2019   PLT 352 06/24/2019   NEUTROABS 4.4 06/24/2019    ASSESSMENT & PLAN:  Ductal carcinoma in situ (DCIS) of right breast 06/19/2019:Baseline routine screening mammogram showed two groups of indeterminate calcifications in the UOQ of the right breast, 1.2cm and 0.8cm. 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  07/22/2019:  Right lumpectomy Marlou Starks): high grade DCIS, clear margins.  ER PR positive Treatment plan: 1.  Adjuvant radiation therapy followed by 2.  Adjuvant tamoxifen for 5 years  Return to clinic at the end of radiation to start tamoxifen.   No orders of the defined types were placed in this encounter.  The patient has a good understanding of the overall plan. she agrees with it. she will call with any problems that may develop before the next visit here.  Nicholas Lose, MD 07/30/2019  Julious Oka Dorshimer am acting as scribe for Dr. Nicholas Lose.  I have reviewed the above documentation for accuracy and completeness, and I agree with the above.

## 2019-07-30 ENCOUNTER — Other Ambulatory Visit: Payer: Self-pay

## 2019-07-30 ENCOUNTER — Ambulatory Visit: Payer: BC Managed Care – PPO | Admitting: Hematology and Oncology

## 2019-07-30 ENCOUNTER — Inpatient Hospital Stay: Payer: BC Managed Care – PPO | Attending: Hematology and Oncology | Admitting: Hematology and Oncology

## 2019-07-30 DIAGNOSIS — D0511 Intraductal carcinoma in situ of right breast: Secondary | ICD-10-CM

## 2019-07-30 DIAGNOSIS — Z170 Estrogen receptor positive status [ER+]: Secondary | ICD-10-CM | POA: Diagnosis not present

## 2019-07-30 NOTE — Assessment & Plan Note (Signed)
06/19/2019:Baseline routine screening mammogram showed two groups of indeterminate calcifications in the UOQ of the right breast, 1.2cm and 0.8cm. 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  07/22/2019: Right lumpectomy Heather Delacruz): high grade DCIS, clear margins.  ER PR positive

## 2019-08-05 ENCOUNTER — Telehealth: Payer: Self-pay | Admitting: Radiation Oncology

## 2019-08-06 ENCOUNTER — Ambulatory Visit
Admission: RE | Admit: 2019-08-06 | Discharge: 2019-08-06 | Disposition: A | Discharge status: Home / Self Care | Payer: BC Managed Care – PPO | Source: Ambulatory Visit | Attending: Radiation Oncology | Admitting: Radiation Oncology

## 2019-08-06 ENCOUNTER — Ambulatory Visit: Payer: BC Managed Care – PPO | Admitting: Radiation Oncology

## 2019-08-06 ENCOUNTER — Other Ambulatory Visit: Payer: Self-pay

## 2019-08-06 ENCOUNTER — Encounter: Payer: Self-pay | Admitting: *Deleted

## 2019-08-06 ENCOUNTER — Encounter: Payer: Self-pay | Admitting: Radiation Oncology

## 2019-08-06 DIAGNOSIS — Z309 Encounter for contraceptive management, unspecified: Secondary | ICD-10-CM

## 2019-08-06 DIAGNOSIS — Z923 Personal history of irradiation: Secondary | ICD-10-CM

## 2019-08-06 DIAGNOSIS — D0511 Intraductal carcinoma in situ of right breast: Secondary | ICD-10-CM

## 2019-08-06 HISTORY — DX: Personal history of irradiation: Z92.3

## 2019-08-06 NOTE — Progress Notes (Signed)
Radiation Oncology         (336) [phone] ________________________________  Follow Up Consultation - Conducted via telephone due to current COVID-19 concerns for limiting patient exposure  I spoke with the patient to conduct this consult visit via telephone to spare the patient unnecessary potential exposure in the healthcare setting during the current COVID-19 pandemic. The patient was notified in advance and was offered a [place] meeting to allow for face to face communication but unfortunately reported that they did not have the appropriate resources/technology to support such a visit and instead preferred to proceed with a telephone visit.  ________________________________  Name: Heather Delacruz        MRN: [id number]  Date of Service: 08/06/2019 DOB: 1981/09/01  EH:MCNOBS, Jonelle Sidle, NP  Nicholas Lose, MD     REFERRING PHYSICIAN: Nicholas Lose, MD   DIAGNOSIS: The encounter diagnosis was Ductal carcinoma in situ (DCIS) of right breast.   HISTORY OF PRESENT ILLNESS: Heather Delacruz is a 38 y.o. female originally seen in the multidisciplinary breast clinic for a new diagnosis of right breast cancer. The patient was noted to have screening detected calcifications in the right breast. Diagnostic imaging revealed two groupings of calcifications in the lateral right breast measuring 1.2 x .7 x .4 cm and an 8 x 7 x 5 mm area both within the UOQ. No ultrasound was performed in the axilla. She underwent stereotactic biopsy on 06/18/2019 revealed high-grade DCIS and LCIS arising in a complex sclerosing lesion and adjacent to this another biopsy consistent with high-grade DCIS, and the tumors were ER PR positive.  She underwent lumpectomy of the left breast on 9/30/202 which revealed a high grade DCIS measuring 5.2 cm  less than 1 mm in the lateral, anterior, and inferior  Margins. She is contacted today by phone to discuss next steps for her treatment.    PREVIOUS RADIATION THERAPY: No   PAST  MEDICAL HISTORY:  Past Medical History:  Diagnosis Date   Cancer ([place])    right breast cancer   Family history of breast cancer    Family history of prostate cancer        PAST SURGICAL HISTORY: Past Surgical History:  Procedure Laterality Date   ANKLE ARTHROSCOPY     BREAST LUMPECTOMY WITH RADIOACTIVE SEED LOCALIZATION Right 07/22/2019   Procedure: RIGHT BREAST LUMPECTOMY WITH BRACKETED RADIOACTIVE SEED LOCALIZATION;  Surgeon: Jovita Kussmaul, MD;  Location: [facility];  Service: General;  Laterality: Right;   HIP SURGERY     SHOULDER SURGERY Right    TARSAL TUNNEL RELEASE       FAMILY HISTORY:  Family History  Problem Relation Age of Onset   Prostate cancer Father    Cervical cancer Sister    Esophageal cancer Maternal Grandfather    Breast cancer Paternal Grandmother    Breast cancer Paternal Aunt        dx 82s   Prostate cancer Paternal Uncle        dx 20s     SOCIAL HISTORY:  reports that she has never smoked. She has never used smokeless tobacco. She reports current alcohol use. She reports that she does not use drugs.  The patient is married and lives in [place].  She has 2 school-aged children.  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  ALLERGIES: Orange oil   MEDICATIONS:  Current Outpatient Medications  Medication Sig Dispense Refill  diazepam (VALIUM) 10 MG tablet Take 10 mg by mouth every 6 (six) hours as needed for anxiety (as needed states that she has not taken it).     HYDROcodone-acetaminophen (NORCO/VICODIN) 5-325 MG tablet Take 1-2 tablets by mouth every 6 (six) hours as needed for moderate pain or severe pain. (Patient not taking: Reported on 08/06/2019) 15 tablet 0   No current facility-administered medications for this encounter.      REVIEW OF SYSTEMS: On review of systems, the patient reports that she is doing well overall. She has been  somewhat sore but denies any concerns with redness of her breast or fullness. She denies any chest pain, shortness of breath, cough, fevers, chills, night sweats, unintended weight changes. She denies any bowel or bladder disturbances, and denies abdominal pain, nausea or vomiting. She denies any new musculoskeletal or joint aches or pains. A complete review of systems is obtained and is otherwise negative.     PHYSICAL EXAM:  Unable to assess due to encounter type.   ECOG = 0  0 - Asymptomatic (Fully active, able to carry on all predisease activities without restriction)  1 - Symptomatic but completely ambulatory (Restricted in physically strenuous activity but ambulatory and able to carry out work of a light or sedentary nature. For example, light housework, office work)  2 - Symptomatic, <50% in bed during the day (Ambulatory and capable of all self care but unable to carry out any work activities. Up and about more than 50% of waking hours)  3 - Symptomatic, >50% in bed, but not bedbound (Capable of only limited self-care, confined to bed or chair 50% or more of waking hours)  4 - Bedbound (Completely disabled. Cannot carry on any self-care. Totally confined to bed or chair)  5 - Death   Eustace Pen MM, Creech RH, Tormey DC, et al. [phone]). "Toxicity and response criteria of the [facility]". [place] Oncol. 5 (6): 649-55    LABORATORY DATA:  Lab Results  Component Value Date   WBC 6.8 06/24/2019   HGB 13.2 06/24/2019   HCT 39.9 06/24/2019   MCV 86.0 06/24/2019   PLT 352 06/24/2019   Lab Results  Component Value Date   NA 140 06/24/2019   K 3.8 06/24/2019   CL 105 06/24/2019   CO2 27 06/24/2019   Lab Results  Component Value Date   ALT 10 06/24/2019   AST 13 (L) 06/24/2019   ALKPHOS 68 06/24/2019   BILITOT 0.3 06/24/2019      RADIOGRAPHY: Mm Breast Surgical Specimen  Result Date: 07/22/2019 CLINICAL DATA:  Status post surgical excision of  biopsy proven DCIS, LCIS and complex sclerosing lesion in the right breast. EXAM: SPECIMEN RADIOGRAPH OF THE RIGHT BREAST COMPARISON:  Previous exam(s). FINDINGS: Status post excision of the right breast. The radioactive seeds and biopsy marker clips are present, completely intact, and were marked for pathology. IMPRESSION: Specimen radiograph of the right breast. Electronically Signed   By: Lillia Mountain M.D.   On: 07/22/2019 10:39   Mm Rt Radioactive Seed Loc Mammo Guide  Result Date: 07/21/2019 CLINICAL DATA:  Pre lumpectomy localization of recently diagnosed DCIS, LCIS and complex sclerosing lesion in the upper-outer quadrant of the right breast and nearby recently diagnosed DCIS and LCIS in the upper-outer quadrant of the right breast. EXAM: MAMMOGRAPHIC GUIDED RADIOACTIVE SEED LOCALIZATION OF THE RIGHT BREAST X 2 COMPARISON:  Previous exam(s). FINDINGS: Patient presents for radioactive seed localization prior to right breast radioactive seed  localizations. I met with the patient and we discussed the procedure of seed localization including benefits and alternatives. We discussed the high likelihood of a successful procedures. We discussed the risks of the procedures including infection, bleeding, tissue injury and further surgery. We discussed the low dose of radioactivity involved in the procedure. Informed, written consent was given. The usual time-out protocol was performed immediately prior to the procedures. SITE 1: COIL SHAPED BIOPSY MARKER CLIP IN THE UPPER-OUTER QUADRANT OF THE RIGHT BREAST Using mammographic guidance, sterile technique, 1% lidocaine and an I-125 radioactive seed, the coil shaped biopsy marker clip in the upper-outer quadrant of the right breast was localized using a lateral approach. The follow-up mammogram images confirm the seed in the expected location and were marked for Dr. Marlou Starks. Follow-up survey of the patient confirms presence of the radioactive seed. Order number of I-125  seed:  [id number]. Total activity:  0.257 mCi reference Date: 07/10/2019 SITE 2: X SHAPED BIOPSY MARKER CLIP IN THE UPPER-OUTER QUADRANT OF THE RIGHT BREAST Using mammographic guidance, sterile technique, 1% lidocaine and an I-125 radioactive seed, the X shaped biopsy marker clip in the upper-outer quadrant of the right breast was localized using a lateral approach. The follow-up mammogram images confirm the seed in the expected location and were marked for Dr. Marlou Starks. Follow-up survey of the patient confirms presence of the radioactive seed. Order number of I-125 seed:  [id number]. Total activity:  0.257 mCi reference Date: 07/10/2019 The patient tolerated the procedures well and was released from the [facility]. She was given instructions regarding seed removal. IMPRESSION: Radioactive seed localization right breast x 2. No apparent complications. Electronically Signed   By: Claudie Revering M.D.   On: 07/21/2019 16:16   Mm Rt Radio Seed Ea Add Lesion Loc Mammo  Result Date: 07/21/2019 CLINICAL DATA:  Pre lumpectomy localization of recently diagnosed DCIS, LCIS and complex sclerosing lesion in the upper-outer quadrant of the right breast and nearby recently diagnosed DCIS and LCIS in the upper-outer quadrant of the right breast. EXAM: MAMMOGRAPHIC GUIDED RADIOACTIVE SEED LOCALIZATION OF THE RIGHT BREAST X 2 COMPARISON:  Previous exam(s). FINDINGS: Patient presents for radioactive seed localization prior to right breast radioactive seed localizations. I met with the patient and we discussed the procedure of seed localization including benefits and alternatives. We discussed the high likelihood of a successful procedures. We discussed the risks of the procedures including infection, bleeding, tissue injury and further surgery. We discussed the low dose of radioactivity involved in the procedure. Informed, written consent was given. The usual time-out protocol was performed immediately prior to the procedures. SITE 1:  COIL SHAPED BIOPSY MARKER CLIP IN THE UPPER-OUTER QUADRANT OF THE RIGHT BREAST Using mammographic guidance, sterile technique, 1% lidocaine and an I-125 radioactive seed, the coil shaped biopsy marker clip in the upper-outer quadrant of the right breast was localized using a lateral approach. The follow-up mammogram images confirm the seed in the expected location and were marked for Dr. Marlou Starks. Follow-up survey of the patient confirms presence of the radioactive seed. Order number of I-125 seed:  [id number]. Total activity:  0.257 mCi reference Date: 07/10/2019 SITE 2: X SHAPED BIOPSY MARKER CLIP IN THE UPPER-OUTER QUADRANT OF THE RIGHT BREAST Using mammographic guidance, sterile technique, 1% lidocaine and an I-125 radioactive seed, the X shaped biopsy marker clip in the upper-outer quadrant of the right breast was localized using a lateral approach. The follow-up mammogram images confirm the seed in the expected location and were marked for Dr.  Marlou Starks. Follow-up survey of the patient confirms presence of the radioactive seed. Order number of I-125 seed:  [id number]. Total activity:  0.257 mCi reference Date: 07/10/2019 The patient tolerated the procedures well and was released from the [facility]. She was given instructions regarding seed removal. IMPRESSION: Radioactive seed localization right breast x 2. No apparent complications. Electronically Signed   By: Claudie Revering M.D.   On: 07/21/2019 16:16       IMPRESSION/PLAN: 1. High grade, ER/PR positive DCIS of the right breast. Dr. Lisbeth Renshaw discusses the pathology findings and reviews the nature of noninvasive breast disease. The consensus from the breast conference includes breast conservation with lumpectomy. She is also contemplating mastectomy. If she had breast conserving surgery, she would benefit from external radiotherapy to the breast followed by antiestrogen therapy to reduce local and systemic recurrence. We discussed the risks, benefits, short, and  long term effects of radiotherapy, and the patient is interested in proceeding. Dr. Lisbeth Renshaw discusses the delivery and logistics of radiotherapy and anticipates a course of 6 1/2 weeks of radiotherapy. We will see her back about 2 weeks after surgery to discuss the simulation process and anticipate we starting radiotherapy about 4-6 weeks after surgery.  2. Contraceptive counseling. We will review the need for urine Hcg at the time of her simulation and to avoid pregnancy during treatment.    Given current concerns for patient exposure during the COVID-19 pandemic, this encounter was conducted via telephone.  The patient has given verbal consent for this type of encounter. The time spent during this encounter was 25 minutes and 50% of that time was spent in the coordination of her care. The attendants for this meeting included Dr. Lisbeth Renshaw, Shona Simpson, [facility] and Carol Ada  During the encounter, Dr. Lisbeth Renshaw and Shona Simpson [facility] were located at [facility] Radiation Oncology Department.  Torre Pikus  was located at home.  The above documentation reflects my direct findings during this shared patient visit. Please see the separate note by Dr. Lisbeth Renshaw on this date for the remainder of the patient's plan of care.    Carola Rhine, PAC

## 2019-08-07 ENCOUNTER — Encounter: Payer: Self-pay | Admitting: Radiation Oncology

## 2019-08-10 ENCOUNTER — Telehealth: Payer: Self-pay | Admitting: *Deleted

## 2019-08-10 NOTE — Telephone Encounter (Signed)
CALLED PATIENT TO INFORM OF MAMMOGRAM ON 08-14-19 - ARRIVAL TIME- 3:30 PM @ [facility], SPOKE WITH PATIENT AND SHE IS AWARE OF THIS APPT.

## 2019-08-13 ENCOUNTER — Telehealth: Payer: Self-pay | Admitting: *Deleted

## 2019-08-13 NOTE — Telephone Encounter (Signed)
Called patient to inform of lab on 08-19-19 @ 8:30 am, spoke with patient and she is aware of this lab

## 2019-08-14 ENCOUNTER — Ambulatory Visit
Admission: RE | Admit: 2019-08-14 | Discharge: 2019-08-14 | Disposition: A | Discharge status: Home / Self Care | Payer: BC Managed Care – PPO | Source: Ambulatory Visit | Attending: Radiation Oncology | Admitting: Radiation Oncology

## 2019-08-14 ENCOUNTER — Other Ambulatory Visit: Payer: Self-pay

## 2019-08-14 ENCOUNTER — Other Ambulatory Visit: Payer: Self-pay | Admitting: Radiation Oncology

## 2019-08-14 DIAGNOSIS — D0511 Intraductal carcinoma in situ of right breast: Secondary | ICD-10-CM

## 2019-08-17 ENCOUNTER — Telehealth: Payer: Self-pay | Admitting: Radiation Oncology

## 2019-08-17 NOTE — Telephone Encounter (Signed)
I spoke with the patient to confirm our plans to proceed on Wednesday with simulation as her mammogram did not show residual calcifications.

## 2019-08-18 ENCOUNTER — Ambulatory Visit: Payer: BC Managed Care – PPO | Admitting: Radiation Oncology

## 2019-08-18 ENCOUNTER — Telehealth: Payer: Self-pay

## 2019-08-18 NOTE — Telephone Encounter (Signed)
Nutrition  Patient called wanting to schedule appointment with RD to learn more about healthy eating and changing dietary habits.  Appointment made for Tuesday, Nov 3 at 3:15pm  [place] Zenia Resides, [place], [place] Registered Dietitian [phone] (pager)

## 2019-08-19 ENCOUNTER — Other Ambulatory Visit: Payer: Self-pay

## 2019-08-19 ENCOUNTER — Other Ambulatory Visit: Payer: Self-pay | Admitting: Radiation Oncology

## 2019-08-19 ENCOUNTER — Ambulatory Visit: Payer: BC Managed Care – PPO

## 2019-08-19 ENCOUNTER — Ambulatory Visit
Admission: RE | Admit: 2019-08-19 | Discharge: 2019-08-19 | Disposition: A | Discharge status: Home / Self Care | Payer: BC Managed Care – PPO | Source: Ambulatory Visit | Attending: Radiation Oncology | Admitting: Radiation Oncology

## 2019-08-19 DIAGNOSIS — Z3202 Encounter for pregnancy test, result negative: Secondary | ICD-10-CM | POA: Diagnosis not present

## 2019-08-19 DIAGNOSIS — D0511 Intraductal carcinoma in situ of right breast: Secondary | ICD-10-CM | POA: Diagnosis not present

## 2019-08-19 LAB — PREGNANCY, URINE: Preg Test, Ur: NEGATIVE

## 2019-08-20 ENCOUNTER — Telehealth: Payer: Self-pay | Admitting: Hematology and Oncology

## 2019-08-20 NOTE — Progress Notes (Signed)
  Radiation Oncology         [phone]) [phone] ________________________________  Name: Heather Delacruz MRN: [id number]  Date: 08/19/2019  DOB: 04/28/1981  DIAGNOSIS:     ICD-10-CM   1. Ductal carcinoma in situ (DCIS) of right breast  D05.11      SIMULATION AND TREATMENT PLANNING NOTE  The patient presented for simulation prior to beginning her course of radiation treatment for her diagnosis of right-sided breast cancer. The patient was placed in a supine position on a breast board. A customized vac-lock bag was constructed and this complex treatment device will be used on a daily basis during her treatment. In this fashion, a CT scan was obtained through the chest area and an isocenter was placed near the chest wall within the breast.  The patient will be planned to receive a course of radiation initially to a dose of 50.4 Gy. This will consist of a whole breast radiotherapy technique. To accomplish this, 2 customized blocks have been designed which will correspond to medial and lateral whole breast tangent fields. This treatment will be accomplished at 1.8 Gy per fraction. A forward planning technique will also be evaluated to determine if this approach improves the plan. It is anticipated that the patient will then receive a 10 Gy boost to the seroma cavity which has been contoured. This will be accomplished at 2 Gy per fraction.   This initial treatment will consist of a 3-D conformal technique. The seroma has been contoured as the primary target structure. Additionally, dose volume histograms of both this target as well as the lungs and heart will also be evaluated. Such an approach is necessary to ensure that the target area is adequately covered while the nearby critical  normal structures are adequately spared.  Plan:  The final anticipated total dose therefore will correspond to 60.4 Gy.    _______________________________   Jodelle Gross, MD, PhD

## 2019-08-20 NOTE — Telephone Encounter (Signed)
Scheduled appt per 10/29 sch message - mailed reminder letter with appt date and time

## 2019-08-20 NOTE — Progress Notes (Signed)
  Radiation Oncology         [phone] ________________________________  Name: Lyric Honnold MRN: [id number]  Date: 08/19/2019  DOB: October 19, 1981  Optical Surface Tracking Plan:  Since intensity modulated radiotherapy (IMRT) and 3D conformal radiation treatment methods are predicated on accurate and precise positioning for treatment, intrafraction motion monitoring is medically necessary to ensure accurate and safe treatment delivery.  The ability to quantify intrafraction motion without excessive ionizing radiation dose can only be performed with optical surface tracking. Accordingly, surface imaging offers the opportunity to obtain 3D measurements of patient position throughout IMRT and 3D treatments without excessive radiation exposure.  I am ordering optical surface tracking for this patient's upcoming course of radiotherapy. ________________________________  Kyung Rudd, MD 08/20/2019 9:44 AM    Reference:   Particia Jasper, et al. Surface imaging-based analysis of intrafraction motion for breast radiotherapy patients.Journal of [place], n. 6, nov. 2014. ISSN [id number].   Available at: <http://www.jacmp.org/index.php/jacmp/article/view/4957>.

## 2019-08-25 DIAGNOSIS — D0511 Intraductal carcinoma in situ of right breast: Secondary | ICD-10-CM | POA: Diagnosis not present

## 2019-08-25 DIAGNOSIS — Z3202 Encounter for pregnancy test, result negative: Secondary | ICD-10-CM | POA: Diagnosis not present

## 2019-08-26 ENCOUNTER — Other Ambulatory Visit: Payer: Self-pay

## 2019-08-26 ENCOUNTER — Ambulatory Visit
Admission: RE | Admit: 2019-08-26 | Discharge: 2019-08-26 | Disposition: A | Discharge status: Home / Self Care | Payer: BC Managed Care – PPO | Source: Ambulatory Visit | Attending: Radiation Oncology | Admitting: Radiation Oncology

## 2019-08-26 ENCOUNTER — Inpatient Hospital Stay: Payer: BC Managed Care – PPO | Attending: Hematology and Oncology

## 2019-08-26 DIAGNOSIS — D0511 Intraductal carcinoma in situ of right breast: Secondary | ICD-10-CM | POA: Diagnosis not present

## 2019-08-26 MED ORDER — RADIAPLEXRX EX GEL
Freq: Once | CUTANEOUS | Status: AC
  Administered 2019-08-26: 15:00:00 via TOPICAL

## 2019-08-26 MED ORDER — ALRA NON-METALLIC DEODORANT (RAD-ONC)
1.0000 "application " | Freq: Once | TOPICAL | Status: AC
  Administered 2019-08-26: 1 via TOPICAL

## 2019-08-26 NOTE — Progress Notes (Signed)
Nutrition Assessment   Reason for Assessment:   Patient interested in healthy eating, reaching healthy weight   ASSESSMENT:   38 year old female with DCIS of right breast.  Patient s/p lumpectomy on 9/30.  Currently receiving adjuvant radiation, followed by tamoxifen.    Met with patient in clinic this pm.  Patient reports good appetite.  Has gone to bariatric clinic in the past for weight loss.  Patient interested in learning nutrition recommendations for cancer and to reach healthy weight.    Medications: reviewed   Labs: reviewed   Anthropometrics:   Height: 65 inches Weight: 202 lb Increased recently BMI: 33    NUTRITION DIAGNOSIS: Food and nutrition related knowledge deficit related to cancer diagnosis as evidenced by patient interested in healthy eating and weight loss   INTERVENTION:  Discussed 10 recommendations from [employer] for [place] Provided my plate plan from [employer].  To help patient reach weight loss goals.  Encouraged exercise as well.  Patient PT and understands the importance of exercise.  Contact information was provided as well.      Next Visit: no further follow-up  Caleah Tortorelli B. Zenia Resides, [place], [place] Registered Dietitian [phone] (pager)

## 2019-08-27 ENCOUNTER — Ambulatory Visit
Admission: RE | Admit: 2019-08-27 | Discharge: 2019-08-27 | Disposition: A | Discharge status: Home / Self Care | Payer: BC Managed Care – PPO | Source: Ambulatory Visit | Attending: Radiation Oncology | Admitting: Radiation Oncology

## 2019-08-27 ENCOUNTER — Other Ambulatory Visit: Payer: Self-pay

## 2019-08-27 DIAGNOSIS — D0511 Intraductal carcinoma in situ of right breast: Secondary | ICD-10-CM | POA: Diagnosis not present

## 2019-08-28 ENCOUNTER — Other Ambulatory Visit: Payer: Self-pay

## 2019-08-28 ENCOUNTER — Ambulatory Visit
Admission: RE | Admit: 2019-08-28 | Discharge: 2019-08-28 | Disposition: A | Discharge status: Home / Self Care | Payer: BC Managed Care – PPO | Source: Ambulatory Visit | Attending: Radiation Oncology | Admitting: Radiation Oncology

## 2019-08-28 DIAGNOSIS — D0511 Intraductal carcinoma in situ of right breast: Secondary | ICD-10-CM | POA: Diagnosis not present

## 2019-08-31 ENCOUNTER — Ambulatory Visit
Admission: RE | Admit: 2019-08-31 | Discharge: 2019-08-31 | Disposition: A | Discharge status: Home / Self Care | Payer: BC Managed Care – PPO | Source: Ambulatory Visit | Attending: Radiation Oncology | Admitting: Radiation Oncology

## 2019-08-31 ENCOUNTER — Other Ambulatory Visit: Payer: Self-pay

## 2019-08-31 DIAGNOSIS — D0511 Intraductal carcinoma in situ of right breast: Secondary | ICD-10-CM | POA: Diagnosis not present

## 2019-09-01 ENCOUNTER — Ambulatory Visit
Admission: RE | Admit: 2019-09-01 | Discharge: 2019-09-01 | Disposition: A | Discharge status: Home / Self Care | Payer: BC Managed Care – PPO | Source: Ambulatory Visit | Attending: Radiation Oncology | Admitting: Radiation Oncology

## 2019-09-01 DIAGNOSIS — D0511 Intraductal carcinoma in situ of right breast: Secondary | ICD-10-CM | POA: Diagnosis not present

## 2019-09-02 ENCOUNTER — Other Ambulatory Visit: Payer: Self-pay

## 2019-09-02 ENCOUNTER — Ambulatory Visit
Admission: RE | Admit: 2019-09-02 | Discharge: 2019-09-02 | Disposition: A | Discharge status: Home / Self Care | Payer: BC Managed Care – PPO | Source: Ambulatory Visit | Attending: Radiation Oncology | Admitting: Radiation Oncology

## 2019-09-02 DIAGNOSIS — D0511 Intraductal carcinoma in situ of right breast: Secondary | ICD-10-CM | POA: Diagnosis not present

## 2019-09-03 ENCOUNTER — Other Ambulatory Visit: Payer: Self-pay

## 2019-09-03 ENCOUNTER — Ambulatory Visit
Admission: RE | Admit: 2019-09-03 | Discharge: 2019-09-03 | Disposition: A | Discharge status: Home / Self Care | Payer: BC Managed Care – PPO | Source: Ambulatory Visit | Attending: Radiation Oncology | Admitting: Radiation Oncology

## 2019-09-03 DIAGNOSIS — D0511 Intraductal carcinoma in situ of right breast: Secondary | ICD-10-CM | POA: Diagnosis not present

## 2019-09-04 ENCOUNTER — Other Ambulatory Visit: Payer: Self-pay

## 2019-09-04 ENCOUNTER — Ambulatory Visit
Admission: RE | Admit: 2019-09-04 | Discharge: 2019-09-04 | Disposition: A | Discharge status: Home / Self Care | Payer: BC Managed Care – PPO | Source: Ambulatory Visit | Attending: Radiation Oncology | Admitting: Radiation Oncology

## 2019-09-04 DIAGNOSIS — D0511 Intraductal carcinoma in situ of right breast: Secondary | ICD-10-CM | POA: Diagnosis not present

## 2019-09-07 ENCOUNTER — Ambulatory Visit
Admission: RE | Admit: 2019-09-07 | Discharge: 2019-09-07 | Disposition: A | Discharge status: Home / Self Care | Payer: BC Managed Care – PPO | Source: Ambulatory Visit | Attending: Radiation Oncology | Admitting: Radiation Oncology

## 2019-09-07 ENCOUNTER — Other Ambulatory Visit: Payer: Self-pay

## 2019-09-07 DIAGNOSIS — D0511 Intraductal carcinoma in situ of right breast: Secondary | ICD-10-CM | POA: Diagnosis not present

## 2019-09-08 ENCOUNTER — Other Ambulatory Visit: Payer: Self-pay

## 2019-09-08 ENCOUNTER — Ambulatory Visit
Admission: RE | Admit: 2019-09-08 | Discharge: 2019-09-08 | Disposition: A | Discharge status: Home / Self Care | Payer: BC Managed Care – PPO | Source: Ambulatory Visit | Attending: Radiation Oncology | Admitting: Radiation Oncology

## 2019-09-08 DIAGNOSIS — D0511 Intraductal carcinoma in situ of right breast: Secondary | ICD-10-CM | POA: Diagnosis not present

## 2019-09-09 ENCOUNTER — Ambulatory Visit
Admission: RE | Admit: 2019-09-09 | Discharge: 2019-09-09 | Disposition: A | Discharge status: Home / Self Care | Payer: BC Managed Care – PPO | Source: Ambulatory Visit | Attending: Radiation Oncology | Admitting: Radiation Oncology

## 2019-09-09 ENCOUNTER — Other Ambulatory Visit: Payer: Self-pay

## 2019-09-09 DIAGNOSIS — D0511 Intraductal carcinoma in situ of right breast: Secondary | ICD-10-CM | POA: Diagnosis not present

## 2019-09-10 ENCOUNTER — Ambulatory Visit
Admission: RE | Admit: 2019-09-10 | Discharge: 2019-09-10 | Disposition: A | Discharge status: Home / Self Care | Payer: BC Managed Care – PPO | Source: Ambulatory Visit | Attending: Radiation Oncology | Admitting: Radiation Oncology

## 2019-09-10 ENCOUNTER — Other Ambulatory Visit: Payer: Self-pay

## 2019-09-10 DIAGNOSIS — D0511 Intraductal carcinoma in situ of right breast: Secondary | ICD-10-CM | POA: Diagnosis not present

## 2019-09-11 ENCOUNTER — Ambulatory Visit
Admission: RE | Admit: 2019-09-11 | Discharge: 2019-09-11 | Disposition: A | Discharge status: Home / Self Care | Payer: BC Managed Care – PPO | Source: Ambulatory Visit | Attending: Radiation Oncology | Admitting: Radiation Oncology

## 2019-09-11 ENCOUNTER — Other Ambulatory Visit: Payer: Self-pay

## 2019-09-11 DIAGNOSIS — D0511 Intraductal carcinoma in situ of right breast: Secondary | ICD-10-CM | POA: Diagnosis not present

## 2019-09-13 ENCOUNTER — Other Ambulatory Visit: Payer: Self-pay

## 2019-09-13 ENCOUNTER — Ambulatory Visit
Admission: RE | Admit: 2019-09-13 | Discharge: 2019-09-13 | Disposition: A | Discharge status: Home / Self Care | Payer: BC Managed Care – PPO | Source: Ambulatory Visit | Attending: Radiation Oncology | Admitting: Radiation Oncology

## 2019-09-13 DIAGNOSIS — D0511 Intraductal carcinoma in situ of right breast: Secondary | ICD-10-CM | POA: Diagnosis not present

## 2019-09-14 ENCOUNTER — Ambulatory Visit
Admission: RE | Admit: 2019-09-14 | Discharge: 2019-09-14 | Disposition: A | Discharge status: Home / Self Care | Payer: BC Managed Care – PPO | Source: Ambulatory Visit | Attending: Radiation Oncology | Admitting: Radiation Oncology

## 2019-09-14 ENCOUNTER — Other Ambulatory Visit: Payer: Self-pay

## 2019-09-14 DIAGNOSIS — D0511 Intraductal carcinoma in situ of right breast: Secondary | ICD-10-CM | POA: Diagnosis not present

## 2019-09-15 ENCOUNTER — Ambulatory Visit
Admission: RE | Admit: 2019-09-15 | Discharge: 2019-09-15 | Disposition: A | Discharge status: Home / Self Care | Payer: BC Managed Care – PPO | Source: Ambulatory Visit | Attending: Radiation Oncology | Admitting: Radiation Oncology

## 2019-09-15 ENCOUNTER — Other Ambulatory Visit: Payer: Self-pay

## 2019-09-15 DIAGNOSIS — D0511 Intraductal carcinoma in situ of right breast: Secondary | ICD-10-CM | POA: Diagnosis not present

## 2019-09-16 ENCOUNTER — Ambulatory Visit
Admission: RE | Admit: 2019-09-16 | Discharge: 2019-09-16 | Disposition: A | Discharge status: Home / Self Care | Payer: BC Managed Care – PPO | Source: Ambulatory Visit | Attending: Radiation Oncology | Admitting: Radiation Oncology

## 2019-09-16 ENCOUNTER — Other Ambulatory Visit: Payer: Self-pay

## 2019-09-16 DIAGNOSIS — D0511 Intraductal carcinoma in situ of right breast: Secondary | ICD-10-CM | POA: Diagnosis not present

## 2019-09-21 ENCOUNTER — Ambulatory Visit
Admission: RE | Admit: 2019-09-21 | Discharge: 2019-09-21 | Disposition: A | Discharge status: Home / Self Care | Payer: BC Managed Care – PPO | Source: Ambulatory Visit | Attending: Radiation Oncology | Admitting: Radiation Oncology

## 2019-09-21 ENCOUNTER — Other Ambulatory Visit: Payer: Self-pay

## 2019-09-21 DIAGNOSIS — D0511 Intraductal carcinoma in situ of right breast: Secondary | ICD-10-CM | POA: Diagnosis not present

## 2019-09-22 ENCOUNTER — Other Ambulatory Visit: Payer: Self-pay

## 2019-09-22 ENCOUNTER — Ambulatory Visit
Admission: RE | Admit: 2019-09-22 | Discharge: 2019-09-22 | Disposition: A | Discharge status: Home / Self Care | Payer: BC Managed Care – PPO | Source: Ambulatory Visit | Attending: Radiation Oncology | Admitting: Radiation Oncology

## 2019-09-22 DIAGNOSIS — Z3202 Encounter for pregnancy test, result negative: Secondary | ICD-10-CM | POA: Diagnosis not present

## 2019-09-22 DIAGNOSIS — D0511 Intraductal carcinoma in situ of right breast: Secondary | ICD-10-CM | POA: Insufficient documentation

## 2019-09-23 ENCOUNTER — Ambulatory Visit
Admission: RE | Admit: 2019-09-23 | Discharge: 2019-09-23 | Disposition: A | Discharge status: Home / Self Care | Payer: BC Managed Care – PPO | Source: Ambulatory Visit | Attending: Radiation Oncology | Admitting: Radiation Oncology

## 2019-09-23 ENCOUNTER — Other Ambulatory Visit: Payer: Self-pay

## 2019-09-23 DIAGNOSIS — D0511 Intraductal carcinoma in situ of right breast: Secondary | ICD-10-CM | POA: Diagnosis not present

## 2019-09-24 ENCOUNTER — Ambulatory Visit
Admission: RE | Admit: 2019-09-24 | Discharge: 2019-09-24 | Disposition: A | Discharge status: Home / Self Care | Payer: BC Managed Care – PPO | Source: Ambulatory Visit | Attending: Radiation Oncology | Admitting: Radiation Oncology

## 2019-09-24 ENCOUNTER — Other Ambulatory Visit: Payer: Self-pay

## 2019-09-24 DIAGNOSIS — D0511 Intraductal carcinoma in situ of right breast: Secondary | ICD-10-CM | POA: Diagnosis not present

## 2019-09-25 ENCOUNTER — Ambulatory Visit
Admission: RE | Admit: 2019-09-25 | Discharge: 2019-09-25 | Disposition: A | Discharge status: Home / Self Care | Payer: BC Managed Care – PPO | Source: Ambulatory Visit | Attending: Radiation Oncology | Admitting: Radiation Oncology

## 2019-09-25 DIAGNOSIS — D0511 Intraductal carcinoma in situ of right breast: Secondary | ICD-10-CM | POA: Diagnosis not present

## 2019-09-28 ENCOUNTER — Other Ambulatory Visit: Payer: Self-pay

## 2019-09-28 ENCOUNTER — Ambulatory Visit
Admission: RE | Admit: 2019-09-28 | Discharge: 2019-09-28 | Disposition: A | Discharge status: Home / Self Care | Payer: BC Managed Care – PPO | Source: Ambulatory Visit | Attending: Radiation Oncology | Admitting: Radiation Oncology

## 2019-09-28 DIAGNOSIS — D0511 Intraductal carcinoma in situ of right breast: Secondary | ICD-10-CM | POA: Diagnosis not present

## 2019-09-29 ENCOUNTER — Other Ambulatory Visit: Payer: Self-pay

## 2019-09-29 ENCOUNTER — Ambulatory Visit
Admission: RE | Admit: 2019-09-29 | Discharge: 2019-09-29 | Disposition: A | Discharge status: Home / Self Care | Payer: BC Managed Care – PPO | Source: Ambulatory Visit | Attending: Radiation Oncology | Admitting: Radiation Oncology

## 2019-09-29 DIAGNOSIS — D0511 Intraductal carcinoma in situ of right breast: Secondary | ICD-10-CM | POA: Diagnosis not present

## 2019-09-30 ENCOUNTER — Other Ambulatory Visit: Payer: Self-pay

## 2019-09-30 ENCOUNTER — Ambulatory Visit
Admission: RE | Admit: 2019-09-30 | Discharge: 2019-09-30 | Disposition: A | Discharge status: Home / Self Care | Payer: BC Managed Care – PPO | Source: Ambulatory Visit | Attending: Radiation Oncology | Admitting: Radiation Oncology

## 2019-09-30 DIAGNOSIS — D0511 Intraductal carcinoma in situ of right breast: Secondary | ICD-10-CM | POA: Diagnosis not present

## 2019-10-01 ENCOUNTER — Ambulatory Visit
Admission: RE | Admit: 2019-10-01 | Discharge: 2019-10-01 | Disposition: A | Discharge status: Home / Self Care | Payer: BC Managed Care – PPO | Source: Ambulatory Visit | Attending: Radiation Oncology | Admitting: Radiation Oncology

## 2019-10-01 ENCOUNTER — Other Ambulatory Visit: Payer: Self-pay

## 2019-10-01 DIAGNOSIS — D0511 Intraductal carcinoma in situ of right breast: Secondary | ICD-10-CM | POA: Diagnosis not present

## 2019-10-02 ENCOUNTER — Other Ambulatory Visit: Payer: Self-pay

## 2019-10-02 ENCOUNTER — Ambulatory Visit: Payer: BC Managed Care – PPO | Admitting: Radiation Oncology

## 2019-10-02 ENCOUNTER — Ambulatory Visit
Admission: RE | Admit: 2019-10-02 | Discharge: 2019-10-02 | Disposition: A | Discharge status: Home / Self Care | Payer: BC Managed Care – PPO | Source: Ambulatory Visit | Attending: Radiation Oncology | Admitting: Radiation Oncology

## 2019-10-02 DIAGNOSIS — D0511 Intraductal carcinoma in situ of right breast: Secondary | ICD-10-CM | POA: Diagnosis not present

## 2019-10-05 ENCOUNTER — Other Ambulatory Visit: Payer: Self-pay

## 2019-10-05 ENCOUNTER — Ambulatory Visit
Admission: RE | Admit: 2019-10-05 | Discharge: 2019-10-05 | Disposition: A | Discharge status: Home / Self Care | Payer: BC Managed Care – PPO | Source: Ambulatory Visit | Attending: Radiation Oncology | Admitting: Radiation Oncology

## 2019-10-05 DIAGNOSIS — D0511 Intraductal carcinoma in situ of right breast: Secondary | ICD-10-CM | POA: Diagnosis not present

## 2019-10-06 ENCOUNTER — Ambulatory Visit: Payer: BC Managed Care – PPO

## 2019-10-06 ENCOUNTER — Ambulatory Visit
Admission: RE | Admit: 2019-10-06 | Discharge: 2019-10-06 | Disposition: A | Discharge status: Home / Self Care | Payer: BC Managed Care – PPO | Source: Ambulatory Visit | Attending: Radiation Oncology | Admitting: Radiation Oncology

## 2019-10-06 DIAGNOSIS — D0511 Intraductal carcinoma in situ of right breast: Secondary | ICD-10-CM | POA: Diagnosis not present

## 2019-10-07 ENCOUNTER — Ambulatory Visit: Payer: BC Managed Care – PPO

## 2019-10-07 ENCOUNTER — Other Ambulatory Visit: Payer: Self-pay

## 2019-10-07 ENCOUNTER — Ambulatory Visit
Admission: RE | Admit: 2019-10-07 | Discharge: 2019-10-07 | Disposition: A | Discharge status: Home / Self Care | Payer: BC Managed Care – PPO | Source: Ambulatory Visit | Attending: Radiation Oncology | Admitting: Radiation Oncology

## 2019-10-07 DIAGNOSIS — D0511 Intraductal carcinoma in situ of right breast: Secondary | ICD-10-CM | POA: Diagnosis not present

## 2019-10-08 ENCOUNTER — Ambulatory Visit
Admission: RE | Admit: 2019-10-08 | Discharge: 2019-10-08 | Disposition: A | Discharge status: Home / Self Care | Payer: BC Managed Care – PPO | Source: Ambulatory Visit | Attending: Radiation Oncology | Admitting: Radiation Oncology

## 2019-10-08 DIAGNOSIS — D0511 Intraductal carcinoma in situ of right breast: Secondary | ICD-10-CM | POA: Diagnosis not present

## 2019-10-09 ENCOUNTER — Ambulatory Visit
Admission: RE | Admit: 2019-10-09 | Discharge: 2019-10-09 | Disposition: A | Discharge status: Home / Self Care | Payer: BC Managed Care – PPO | Source: Ambulatory Visit | Attending: Radiation Oncology | Admitting: Radiation Oncology

## 2019-10-09 ENCOUNTER — Other Ambulatory Visit: Payer: Self-pay

## 2019-10-09 DIAGNOSIS — D0511 Intraductal carcinoma in situ of right breast: Secondary | ICD-10-CM | POA: Diagnosis not present

## 2019-10-12 ENCOUNTER — Encounter: Payer: Self-pay | Admitting: *Deleted

## 2019-10-12 ENCOUNTER — Ambulatory Visit
Admission: RE | Admit: 2019-10-12 | Discharge: 2019-10-12 | Disposition: A | Discharge status: Home / Self Care | Payer: BC Managed Care – PPO | Source: Ambulatory Visit | Attending: Radiation Oncology | Admitting: Radiation Oncology

## 2019-10-12 ENCOUNTER — Ambulatory Visit: Payer: BC Managed Care – PPO

## 2019-10-12 ENCOUNTER — Encounter: Payer: Self-pay | Admitting: Radiation Oncology

## 2019-10-12 DIAGNOSIS — D0511 Intraductal carcinoma in situ of right breast: Secondary | ICD-10-CM | POA: Diagnosis not present

## 2019-10-12 NOTE — Progress Notes (Signed)
Patient Care Team: Nena Polio, NP as PCP - General (Family Medicine) Mauro Kaufmann, RN as Oncology Nurse Navigator Rockwell Germany, RN as Oncology Nurse Navigator Jovita Kussmaul, MD as Consulting Physician (General Surgery) Nicholas Lose, MD as Consulting Physician (Hematology and Oncology) Kyung Rudd, MD as Consulting Physician (Radiation Oncology)  DIAGNOSIS:    ICD-10-CM   1. Ductal carcinoma in situ (DCIS) of right breast  D05.11     SUMMARY OF ONCOLOGIC HISTORY: Oncology History  Ductal carcinoma in situ (DCIS) of right breast  06/19/2019 Initial Diagnosis   Baseline routine screening mammogram showed two groups of indeterminate calcifications in the UOQ of the right breast, 1.2cm and 0.8cm. 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    Genetic Testing   Negative genetic testing. No pathogenic variants identified on the [facility] Breast cancer STAT Panel + Common Hereditary Cancers Panel. The STAT Breast cancer panel offered by [facility] includes sequencing and rearrangement analysis for the following 9 genes:  ATM, BRCA1, BRCA2, CDH1, CHEK2, PALB2, PTEN, STK11 and TP53.  The Common Hereditary Cancers Panel offered by [facility] includes sequencing and/or deletion duplication testing of the following 47 genes: APC, ATM, AXIN2, BARD1, BMPR1A, BRCA1, BRCA2, BRIP1, CDH1, CDKN2A (p14ARF), CDKN2A (p16INK4a), CKD4, CHEK2, CTNNA1, DICER1, EPCAM (Deletion/duplication testing only), GREM1 (promoter region deletion/duplication testing only), KIT, MEN1, MLH1, MSH2, MSH3, MSH6, MUTYH, NBN, NF1, NHTL1, PALB2, PDGFRA, PMS2, POLD1, POLE, PTEN, RAD50, RAD51C, RAD51D, SDHB, SDHC, SDHD, SMAD4, SMARCA4. STK11, TP53, TSC1, TSC2, and VHL.  The following genes were evaluated for sequence changes only: SDHA and HOXB13 c.251G>A variant only. The report date is 06/30/2019.    07/22/2019 Surgery   Right lumpectomy Marlou Starks): 5.2 cm high grade DCIS, clear margins.  Complex sclerosing lesion ER 90%, PR 100%   07/22/2019 Cancer Staging   Staging form: Breast, AJCC 8th Edition - Pathologic stage from 07/22/2019: Stage 0 (pTis (DCIS), pN0, cM0, ER+, PR+) - Signed by Gardenia Phlegm, NP on 08/05/2019   08/27/2019 -  Radiation Therapy   Adjuvant radiation     CHIEF COMPLIANT: Follow-up to discuss antiestrogen therapy  INTERVAL HISTORY: Heather Delacruz is a 38 y.o. with above-mentioned history of right breast DCIS for which she underwent a lumpectomy and is currently on radiation therapy. She presents to the clinic today to discuss antiestrogen therapy.  She has had mild radiation dermatitis but otherwise doing quite well.  REVIEW OF SYSTEMS:   Constitutional: Denies fevers, chills or abnormal weight loss Eyes: Denies blurriness of vision Ears, nose, mouth, throat, and face: Denies mucositis or sore throat Respiratory: Denies cough, dyspnea or wheezes Cardiovascular: Denies palpitation, chest discomfort Gastrointestinal: Denies nausea, heartburn or change in bowel habits Skin: Denies abnormal skin rashes Lymphatics: Denies new lymphadenopathy or easy bruising Neurological: Denies numbness, tingling or new weaknesses Behavioral/Psych: Mood is stable, no new changes  Extremities: No lower extremity edema Breast: denies any pain or lumps or nodules in either breasts All other systems were reviewed with the patient and are negative.  I have reviewed the past medical history, past surgical history, social history and family history with the patient and they are unchanged from previous note.  ALLERGIES:  is allergic to orange oil.  MEDICATIONS:  Current Outpatient Medications  Medication Sig Dispense Refill  . diazepam (VALIUM) 10 MG tablet Take 10 mg by mouth every 6 (six) hours  as needed for anxiety (as needed states that she has not taken it).    Marland Kitchen  HYDROcodone-acetaminophen (NORCO/VICODIN) 5-325 MG tablet Take 1-2 tablets by mouth every 6 (six) hours as needed for moderate pain or severe pain. (Patient not taking: Reported on 08/06/2019) 15 tablet 0  . tamoxifen (NOLVADEX) 20 MG tablet Take 1 tablet (20 mg total) by mouth daily. 90 tablet 3   No current facility-administered medications for this visit.    PHYSICAL EXAMINATION: ECOG PERFORMANCE STATUS: 1 - Symptomatic but completely ambulatory  Vitals:   10/13/19 1529  BP: 120/84  Pulse: 73  Resp: 18  Temp: 98.7 F (37.1 C)  SpO2: 100%   Filed Weights   10/13/19 1529  Weight: 204 lb 1.6 oz (92.6 kg)    GENERAL: alert, no distress and comfortable SKIN: skin color, texture, turgor are normal, no rashes or significant lesions EYES: normal, Conjunctiva are pink and non-injected, sclera clear OROPHARYNX: no exudate, no erythema and lips, buccal mucosa, and tongue normal  NECK: supple, thyroid normal size, non-tender, without nodularity LYMPH: no palpable lymphadenopathy in the cervical, axillary or inguinal LUNGS: clear to auscultation and percussion with normal breathing effort HEART: regular rate & rhythm and no murmurs and no lower extremity edema ABDOMEN: abdomen soft, non-tender and normal bowel sounds MUSCULOSKELETAL: no cyanosis of digits and no clubbing  NEURO: alert & oriented x 3 with fluent speech, no focal motor/sensory deficits EXTREMITIES: No lower extremity edema  LABORATORY DATA:  I have reviewed the data as listed CMP Latest Ref Rng & Units 06/24/2019  Glucose 70 - 99 mg/dL 89  BUN 6 - 20 mg/dL 12  Creatinine 0.44 - 1.00 mg/dL 0.79  Sodium 135 - 145 mmol/L 140  Potassium 3.5 - 5.1 mmol/L 3.8  Chloride 98 - 111 mmol/L 105  CO2 22 - 32 mmol/L 27  Calcium 8.9 - 10.3 mg/dL 9.2  Total Protein 6.5 - 8.1 g/dL 7.6  Total Bilirubin 0.3 - 1.2 mg/dL 0.3  Alkaline Phos 38 - 126 U/L 68  AST 15 - 41 U/L 13(L)  ALT 0 - 44 U/L 10    Lab Results  Component Value  Date   WBC 6.8 06/24/2019   HGB 13.2 06/24/2019   HCT 39.9 06/24/2019   MCV 86.0 06/24/2019   PLT 352 06/24/2019   NEUTROABS 4.4 06/24/2019    ASSESSMENT & PLAN:  Ductal carcinoma in situ (DCIS) of right breast 06/19/2019:Baseline routine screening mammogram showed two groups of indeterminate calcifications in the UOQ of the right breast, 1.2cm and 0.8cm. 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  07/22/2019: Right lumpectomy Marlou Starks): high grade DCIS, clear margins.  ER PR positive Treatment plan: 1.  Adjuvant radiation therapy 08/27/2019-10/12/2019 2.  Adjuvant tamoxifen for 5 years  Tamoxifen counseling:We discussed the risks and benefits of tamoxifen. These include but not limited to insomnia, hot flashes, mood changes, vaginal dryness, and weight gain. Although rare, serious side effects including endometrial cancer, risk of blood clots were also discussed. We strongly believe that the benefits far outweigh the risks. Patient understands these risks and consented to starting treatment. Planned treatment duration is 5 years.  Return to clinic in 3 months for survivorship care plan visit      No orders of the defined types were placed in this encounter.  The patient has a good understanding of the  overall plan. she agrees with it. she will call with any problems that may develop before the next visit here.  Nicholas Lose, MD 10/13/2019  Julious Oka Dorshimer, am acting as scribe for Dr. Nicholas Lose.  I have reviewed the above document for accuracy and completeness, and I agree with the above.

## 2019-10-13 ENCOUNTER — Inpatient Hospital Stay: Payer: BC Managed Care – PPO | Attending: Hematology and Oncology | Admitting: Hematology and Oncology

## 2019-10-13 ENCOUNTER — Ambulatory Visit: Payer: BC Managed Care – PPO

## 2019-10-13 ENCOUNTER — Other Ambulatory Visit: Payer: Self-pay

## 2019-10-13 DIAGNOSIS — Z923 Personal history of irradiation: Secondary | ICD-10-CM | POA: Insufficient documentation

## 2019-10-13 DIAGNOSIS — Z79810 Long term (current) use of selective estrogen receptor modulators (SERMs): Secondary | ICD-10-CM | POA: Insufficient documentation

## 2019-10-13 DIAGNOSIS — L598 Other specified disorders of the skin and subcutaneous tissue related to radiation: Secondary | ICD-10-CM | POA: Diagnosis not present

## 2019-10-13 DIAGNOSIS — Z170 Estrogen receptor positive status [ER+]: Secondary | ICD-10-CM | POA: Diagnosis not present

## 2019-10-13 DIAGNOSIS — D0511 Intraductal carcinoma in situ of right breast: Secondary | ICD-10-CM | POA: Diagnosis not present

## 2019-10-13 DIAGNOSIS — Z79899 Other long term (current) drug therapy: Secondary | ICD-10-CM | POA: Diagnosis not present

## 2019-10-13 MED ORDER — TAMOXIFEN CITRATE 20 MG PO TABS: 20.0000 mg | ORAL_TABLET | Freq: Every day | ORAL | 3 refills | Status: DC

## 2019-10-13 NOTE — Assessment & Plan Note (Signed)
06/19/2019:Baseline routine screening mammogram showed two groups of indeterminate calcifications in the UOQ of the right breast, 1.2cm and 0.8cm. 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  07/22/2019: Right lumpectomy Marlou Starks): high grade DCIS, clear margins.  ER PR positive Treatment plan: 1.  Adjuvant radiation therapy 08/27/2019-10/12/2019 2.  Adjuvant tamoxifen for 5 years  Tamoxifen counseling:We discussed the risks and benefits of tamoxifen. These include but not limited to insomnia, hot flashes, mood changes, vaginal dryness, and weight gain. Although rare, serious side effects including endometrial cancer, risk of blood clots were also discussed. We strongly believe that the benefits far outweigh the risks. Patient understands these risks and consented to starting treatment. Planned treatment duration is 5 years.  Return to clinic in 3 months for survivorship care plan visit

## 2019-10-14 ENCOUNTER — Telehealth: Payer: Self-pay | Admitting: Adult Health

## 2019-10-14 NOTE — Telephone Encounter (Signed)
I talk with patient regarding schedule  

## 2019-10-19 ENCOUNTER — Encounter: Payer: Self-pay | Admitting: *Deleted

## 2019-11-05 ENCOUNTER — Telehealth: Payer: Self-pay | Admitting: Radiation Oncology

## 2019-11-05 NOTE — Telephone Encounter (Signed)
  Radiation Oncology         [phone]) [phone] ________________________________  Name: Heather Delacruz MRN: [id number]  Date of Service: 11/05/2019  DOB: [birth year]  Post Treatment Telephone Note  Diagnosis: High grade, ER/PR positive DCIS of the right breast  Interval Since Last Radiation:  4 weeks   08/26/2019-10/12/2019: The right breast was treated to 50.4 Gy in 28 fractions, followed by a 10 Gy boost in 5 fractions to the surgical cavity.  Narrative:  The patient was contacted today for routine follow-up. During treatment she did very well with radiotherapy and did not have significant desquamation.   Impression/Plan: 1. High grade, ER/PR positive DCIS of the right breast. I couldn't reach the patient and she didn't have a VM box set up. I called her husband and reviewed that we would be happy to continue to follow her as needed, but she will also continue to follow up with Dr. Lindi Adie in medical oncology. We revieweed skin care as well as measures to avoid sun exposure to this area.  2. Survivorship. Mr. Garver was encouraged to have his wife call me so we can further discuss, but she is scheduled for survivorship visit in March 2021.    Carola Rhine, PAC

## 2020-01-14 ENCOUNTER — Telehealth: Payer: Self-pay

## 2020-01-14 ENCOUNTER — Inpatient Hospital Stay: Payer: BC Managed Care – PPO | Attending: Adult Health | Admitting: Adult Health

## 2020-01-14 ENCOUNTER — Other Ambulatory Visit: Payer: Self-pay

## 2020-01-14 ENCOUNTER — Encounter: Payer: Self-pay | Admitting: Adult Health

## 2020-01-14 VITALS — BP 104/88 | HR 79 | Temp 98.7°F | Resp 18 | Ht 65.0 in | Wt 201.6 lb

## 2020-01-14 DIAGNOSIS — Z79899 Other long term (current) drug therapy: Secondary | ICD-10-CM | POA: Diagnosis not present

## 2020-01-14 DIAGNOSIS — Z803 Family history of malignant neoplasm of breast: Secondary | ICD-10-CM | POA: Diagnosis not present

## 2020-01-14 DIAGNOSIS — Z170 Estrogen receptor positive status [ER+]: Secondary | ICD-10-CM | POA: Insufficient documentation

## 2020-01-14 DIAGNOSIS — Z923 Personal history of irradiation: Secondary | ICD-10-CM | POA: Insufficient documentation

## 2020-01-14 DIAGNOSIS — Z79810 Long term (current) use of selective estrogen receptor modulators (SERMs): Secondary | ICD-10-CM | POA: Insufficient documentation

## 2020-01-14 DIAGNOSIS — D0511 Intraductal carcinoma in situ of right breast: Secondary | ICD-10-CM | POA: Insufficient documentation

## 2020-01-14 DIAGNOSIS — R232 Flushing: Secondary | ICD-10-CM | POA: Diagnosis not present

## 2020-01-14 NOTE — Progress Notes (Signed)
SURVIVORSHIP VISIT:   BRIEF ONCOLOGIC HISTORY:  Oncology History  Ductal carcinoma in situ (DCIS) of right breast  06/19/2019 Initial Diagnosis   Baseline routine screening mammogram showed two groups of indeterminate calcifications in the UOQ of the right breast, 1.2cm and 0.8cm. 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   07/01/2019 Genetic Testing   Negative genetic testing. No pathogenic variants identified on the [facility] Breast cancer STAT Panel + Common Hereditary Cancers Panel. The STAT Breast cancer panel offered by [facility] includes sequencing and rearrangement analysis for the following 9 genes:  ATM, BRCA1, BRCA2, CDH1, CHEK2, PALB2, PTEN, STK11 and TP53.  The Common Hereditary Cancers Panel offered by [facility] includes sequencing and/or deletion duplication testing of the following 47 genes: APC, ATM, AXIN2, BARD1, BMPR1A, BRCA1, BRCA2, BRIP1, CDH1, CDKN2A (p14ARF), CDKN2A (p16INK4a), CKD4, CHEK2, CTNNA1, DICER1, EPCAM (Deletion/duplication testing only), GREM1 (promoter region deletion/duplication testing only), KIT, MEN1, MLH1, MSH2, MSH3, MSH6, MUTYH, NBN, NF1, NHTL1, PALB2, PDGFRA, PMS2, POLD1, POLE, PTEN, RAD50, RAD51C, RAD51D, SDHB, SDHC, SDHD, SMAD4, SMARCA4. STK11, TP53, TSC1, TSC2, and VHL.  The following genes were evaluated for sequence changes only: SDHA and HOXB13 c.251G>A variant only. The report date is 06/30/2019.   07/22/2019 Surgery   Right lumpectomy Marlou Starks) [id number]): 5.2 cm high grade DCIS, clear margins.  Complex sclerosing lesion ER 90%, PR 100%. No regional lymph nodes were examined.   07/22/2019 Cancer Staging   Staging form: Breast, AJCC 8th Edition - Pathologic stage from 07/22/2019: Stage 0 (pTis (DCIS), pN0, cM0, ER+, PR+)   08/27/2019 - 10/12/2019 Radiation Therapy   The patient initially received a dose of 50.4 Gy in 28 fractions  to the breast using whole-breast tangent fields. This was delivered using a 3-D conformal technique. The pt received a boost delivering an additional 10 Gy in 5 fractions using a electron boost with 59mV electrons. The total dose was 60.4 Gy.    09/2019 - 09/2024 Anti-estrogen oral therapy   Tamoxifen     INTERVAL HISTORY:  [name] review her survivorship care plan detailing her treatment course for breast cancer, as well as monitoring long-term side effects of that treatment, education regarding health maintenance, screening, and overall wellness and health promotion.     Overall, Ms. MLindenbergerreports feeling quite well.  She is taking Tamoxifen daily and is tolerating it well.  She completed survivorship survey and was positive for hot flashes.  Otherwise she has no issues.  She denies any current sexual function issues.  She received her covid vaccine.  She is taking vitamins.  She is feeling well.    REVIEW OF SYSTEMS:  Review of Systems  Constitutional: Negative for appetite change, chills, fatigue, fever and unexpected weight change.  HENT:   Negative for hearing loss, lump/mass and sore throat.   Eyes: Negative for eye problems and icterus.  Respiratory: Negative for chest tightness, cough and shortness of breath.   Cardiovascular: Negative for chest pain, leg swelling and palpitations.  Gastrointestinal: Negative for abdominal distention, abdominal pain, constipation, diarrhea, nausea, rectal pain and vomiting.  Endocrine: Positive for hot flashes (mild, intermittent and tolerable).  Genitourinary: Negative for difficulty urinating.   Musculoskeletal: Negative for arthralgias.  Skin: Negative for itching and rash.  Neurological: Negative for dizziness, extremity weakness, headaches and numbness.  Hematological: Negative for adenopathy. Does not bruise/bleed easily.  Psychiatric/Behavioral: Negative for  depression. The patient is not nervous/anxious.   Breast: Denies any  new nodularity, masses, tenderness, nipple changes, or nipple discharge.      ONCOLOGY TREATMENT TEAM:  1. Surgeon:  Dr. Marlou Starks at [facility] 2. Medical Oncologist: Dr. Lindi Adie  3. Radiation Oncologist: Dr. Lisbeth Renshaw    PAST MEDICAL/SURGICAL HISTORY:  Past Medical History:  Diagnosis Date  . Cancer [facility])    right breast cancer  . Family history of breast cancer   . Family history of prostate cancer    Past Surgical History:  Procedure Laterality Date  . ANKLE ARTHROSCOPY    . BREAST LUMPECTOMY WITH RADIOACTIVE SEED LOCALIZATION Right 07/22/2019   Procedure: RIGHT BREAST LUMPECTOMY WITH BRACKETED RADIOACTIVE SEED LOCALIZATION;  Surgeon: Jovita Kussmaul, MD;  Location: [facility];  Service: General;  Laterality: Right;  . HIP SURGERY    . SHOULDER SURGERY Right   . TARSAL TUNNEL RELEASE       ALLERGIES:  Allergies  Allergen Reactions  . Orange Oil Itching and Other (See Comments)    FRUIT FRUIT      CURRENT MEDICATIONS:  Outpatient Encounter Medications as of 01/14/2020  Medication Sig  . diazepam (VALIUM) 10 MG tablet Take 10 mg by mouth every 6 (six) hours as needed for anxiety (as needed states that she has not taken it).  . tamoxifen (NOLVADEX) 20 MG tablet Take 1 tablet (20 mg total) by mouth daily.  Marland Kitchen HYDROcodone-acetaminophen (NORCO/VICODIN) 5-325 MG tablet Take 1-2 tablets by mouth every 6 (six) hours as needed for moderate pain or severe pain. (Patient not taking: Reported on 08/06/2019)   No facility-administered encounter medications on file as of 01/14/2020.     ONCOLOGIC FAMILY HISTORY:  Family History  Problem Relation Age of Onset  . Prostate cancer Father   . Cervical cancer Sister   . Esophageal cancer Maternal Grandfather   . Breast cancer Paternal Grandmother   . Breast cancer Paternal Aunt        dx 48s  . Prostate cancer Paternal Uncle        dx 50s     GENETIC COUNSELING/TESTING: See above  SOCIAL HISTORY:   Social History   Socioeconomic History  . Marital status: Married    Spouse name: Not on file  . Number of children: Not on file  . Years of education: Not on file  . Highest education level: Not on file  Occupational History  . Not on file  Tobacco Use  . Smoking status: Never Smoker  . Smokeless tobacco: Never Used  Substance and Sexual Activity  . Alcohol use: Yes    Comment: social  . Drug use: Never  . Sexual activity: Not on file  Other Topics Concern  . Not on file  Social History Narrative  . Not on file   Social Determinants of Health   Financial Resource Strain:   . Difficulty of Paying Living Expenses:   Food Insecurity:   . Worried About Charity fundraiser in the Last Year:   . Arboriculturist in the Last Year:   Transportation Needs:   . Film/video editor (Medical):   Marland Kitchen Lack of Transportation (Non-Medical):   Physical Activity:   . Days of Exercise per Week:   . Minutes of Exercise per Session:   Stress:   . Feeling of Stress :   Social Connections:   . Frequency of Communication with Friends and Family:   . Frequency of Social  Gatherings with Friends and Family:   . Attends Religious Services:   . Active Member of Clubs or Organizations:   . Attends Archivist Meetings:   Marland Kitchen Marital Status:   Intimate Partner Violence:   . Fear of Current or Ex-Partner:   . Emotionally Abused:   Marland Kitchen Physically Abused:   . Sexually Abused:      OBSERVATIONS/OBJECTIVE:  BP 104/88 (BP Location: Left Arm, Patient Position: Sitting)   Pulse 79   Temp 98.7 F (37.1 C) (Temporal)   Resp 18   Ht '5\' 5"'$  (1.651 m)   Wt 201 lb 9.6 oz (91.4 kg)   SpO2 100%   BMI 33.55 kg/m  GENERAL: Patient is a well appearing female in no acute distress HEENT:  Sclerae anicteric. Mask in place. Neck is supple.  NODES:  No cervical, supraclavicular, or axillary lymphadenopathy palpated.  BREAST EXAM:  Right breast s/p lumpectomy and radiation.  No sign of local  recurrence, left breast benign.  LUNGS:  Clear to auscultation bilaterally.  No wheezes or rhonchi. HEART:  Regular rate and rhythm. No murmur appreciated. ABDOMEN:  Soft, nontender.  Positive, normoactive bowel sounds. No organomegaly palpated. MSK:  No focal spinal tenderness to palpation. Full range of motion bilaterally in the upper extremities. EXTREMITIES:  No peripheral edema.   SKIN:  Clear with no obvious rashes or skin changes. No nail dyscrasia. NEURO:  Nonfocal. Well oriented.  Appropriate affect.    LABORATORY DATA:  None for this visit.  DIAGNOSTIC IMAGING:  None for this visit.      ASSESSMENT AND PLAN:  Ms.. Heather Delacruz is a pleasant 39 y.o. female with Stage 0 right breast DCIS, ER+/PR+, diagnosed in 06/2019, treated with lumpectomy, adjuvant radiation therapy, and anti-estrogen therapy with Tamoxifen beginning in 10/2019.  She presents to the Survivorship Clinic for our initial meeting and routine follow-up post-completion of treatment for breast cancer.    1. Stage 0 right breast cancer:  Ms. Lumadue is continuing to recover from definitive treatment for breast cancer. She will follow-up with her medical oncologist, Dr. Lindi Adie in 6 months with history and physical exam per surveillance protocol.  She will continue her anti-estrogen therapy with Tamoxifen. Thus far, she is tolerating the Tamoxifen well, with minimal side effects. She was instructed to make Dr. Lindi Adie or myself aware if she begins to experience any worsening side effects of the medication and I could see her back in clinic to help manage those side effects, as needed. Her mammogram is due 05/2020; orders placed today.  Her breast density is category C. Today, a comprehensive survivorship care plan and treatment summary was reviewed with the patient today detailing her breast cancer diagnosis, treatment course, potential late/long-term effects of treatment, appropriate follow-up care with recommendations for the  future, and patient education resources.  A copy of this summary, along with a letter will be sent to the patient's primary care provider via mail/fax/In Basket message after today's visit.    2. Bone health:  She was given education on specific activities to promote bone health.  3. Cancer screening:  Due to Ms. Postlewait's history and her age, she should receive screening for skin cancers, colon cancer, and gynecologic cancers.  The information and recommendations are listed on the patient's comprehensive care plan/treatment summary and were reviewed in detail with the patient.    4. Health maintenance and wellness promotion: Ms. Wernli was encouraged to consume 5-7 servings of fruits and vegetables per day. We reviewed the "  Nutrition Rainbow" handout, as well as the handout "Take Control of Your Health and Reduce Your Cancer Risk" from the [place].  She was also encouraged to engage in moderate to vigorous exercise for 30 minutes per day most days of the week. We discussed the [employer] fitness program, which is designed for cancer survivors to help them become more physically fit after cancer treatments.  She was instructed to limit her alcohol consumption and continue to abstain from tobacco use.     5. Support services/counseling: It is not uncommon for this period of the patient's cancer care trajectory to be one of many emotions and stressors.  We discussed how this can be increasingly difficult during the times of quarantine and social distancing due to the COVID-19 pandemic.   She was given information regarding our available services and encouraged to contact me with any questions or for help enrolling in any of our support group/programs.    Follow up instructions:    -Return to cancer center 6 months for f/u with Dr. Lindi Adie  -Mammogram due in 05/2020 -Follow up with surgery 12/2020 -She is welcome to return back to the Survivorship Clinic at any time; no additional  follow-up needed at this time.  -Consider referral back to survivorship as a long-term survivor for continued surveillance  The patient was provided an opportunity to ask questions and all were answered. The patient agreed with the plan and demonstrated an understanding of the instructions.   Total encounter time: 30 minutes  Wilber Bihari, NP 01/14/20 3:55 PM Medical Oncology and Hematology [facility] [place], [place] [address] Tel. [phone]    Fax. [phone]  *Total Encounter Time as defined by the Centers for Medicare and Medicaid Services includes, in addition to the face-to-face time of a patient visit (documented in the note above) non-face-to-face time: obtaining and reviewing outside history, ordering and reviewing medications, tests or procedures, care coordination (communications with other health care professionals or caregivers) and documentation in the medical record.

## 2020-01-15 ENCOUNTER — Telehealth: Payer: Self-pay | Admitting: Adult Health

## 2020-01-15 NOTE — Telephone Encounter (Signed)
Scheduled appt per 3/25 los. Pt confirmed new appt date and time.  

## 2020-03-03 NOTE — Progress Notes (Signed)
  Radiation Oncology         [phone] ________________________________  Name: Heather Delacruz MRN: [id number]  Date: 10/12/2019  DOB: [birth year]  End of Treatment Note  Diagnosis:   right-sided breast cancer     Indication for treatment:  Curative       Radiation treatment dates:   08/25/20 - 10/12/19  Site/dose:   The patient initially received a dose of 50.4 Gy in 28 fractions to the breast using whole-breast tangent fields. This was delivered using a 3-D conformal technique. The patient then received a boost to the seroma. This delivered an additional 10 Gy in 5 fractions using a 3-field photon boost technique. The total dose was 60.4 Gy.  Narrative: The patient tolerated radiation treatment relatively well.   The patient had some expected skin irritation as she progressed during treatment. Moist desquamation was not present at the end of treatment.  Plan: The patient has completed radiation treatment. The patient will return to radiation oncology clinic for routine followup in one month. I advised the patient to call or return sooner if they have any questions or concerns related to their recovery or treatment. ________________________________  Jodelle Gross, M.D., Ph.D.

## 2020-06-07 ENCOUNTER — Other Ambulatory Visit: Payer: Self-pay

## 2020-06-07 ENCOUNTER — Ambulatory Visit
Admission: RE | Admit: 2020-06-07 | Discharge: 2020-06-07 | Disposition: A | Discharge status: Home / Self Care | Payer: BC Managed Care – PPO | Source: Ambulatory Visit | Attending: Adult Health | Admitting: Adult Health

## 2020-06-07 DIAGNOSIS — D0511 Intraductal carcinoma in situ of right breast: Secondary | ICD-10-CM

## 2020-06-07 HISTORY — DX: Malignant neoplasm of unspecified site of unspecified female breast: C50.919

## 2020-06-16 ENCOUNTER — Other Ambulatory Visit: Payer: Self-pay

## 2020-06-16 ENCOUNTER — Ambulatory Visit
Admission: RE | Admit: 2020-06-16 | Discharge: 2020-06-16 | Disposition: A | Discharge status: Home / Self Care | Payer: BC Managed Care – PPO | Source: Ambulatory Visit | Attending: Adult Health | Admitting: Adult Health

## 2020-07-17 NOTE — Progress Notes (Signed)
Patient Care Team: Nena Polio, NP as PCP - General (Family Medicine) Jovita Kussmaul, MD as Consulting Physician (General Surgery) Nicholas Lose, MD as Consulting Physician (Hematology and Oncology) Kyung Rudd, MD as Consulting Physician (Radiation Oncology)  DIAGNOSIS:    ICD-10-CM   1. Ductal carcinoma in situ (DCIS) of right breast  D05.11     SUMMARY OF ONCOLOGIC HISTORY: Oncology History  Ductal carcinoma in situ (DCIS) of right breast  06/19/2019 Initial Diagnosis   Baseline routine screening mammogram showed two groups of indeterminate calcifications in the UOQ of the right breast, 1.2cm and 0.8cm. 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   07/01/2019 Genetic Testing   Negative genetic testing. No pathogenic variants identified on the [facility] Breast cancer STAT Panel + Common Hereditary Cancers Panel. The STAT Breast cancer panel offered by [facility] includes sequencing and rearrangement analysis for the following 9 genes:  ATM, BRCA1, BRCA2, CDH1, CHEK2, PALB2, PTEN, STK11 and TP53.  The Common Hereditary Cancers Panel offered by [facility] includes sequencing and/or deletion duplication testing of the following 47 genes: APC, ATM, AXIN2, BARD1, BMPR1A, BRCA1, BRCA2, BRIP1, CDH1, CDKN2A (p14ARF), CDKN2A (p16INK4a), CKD4, CHEK2, CTNNA1, DICER1, EPCAM (Deletion/duplication testing only), GREM1 (promoter region deletion/duplication testing only), KIT, MEN1, MLH1, MSH2, MSH3, MSH6, MUTYH, NBN, NF1, NHTL1, PALB2, PDGFRA, PMS2, POLD1, POLE, PTEN, RAD50, RAD51C, RAD51D, SDHB, SDHC, SDHD, SMAD4, SMARCA4. STK11, TP53, TSC1, TSC2, and VHL.  The following genes were evaluated for sequence changes only: SDHA and HOXB13 c.251G>A variant only. The report date is 06/30/2019.   07/22/2019 Surgery   Right lumpectomy Heather Delacruz) [phone]): 5.2 cm high grade DCIS, clear  margins.  Complex sclerosing lesion ER 90%, PR 100%. No regional lymph nodes were examined.   07/22/2019 Cancer Staging   Staging form: Breast, AJCC 8th Edition - Pathologic stage from 07/22/2019: Stage 0 (pTis (DCIS), pN0, cM0, ER+, PR+)   08/27/2019 - 10/12/2019 Radiation Therapy   The patient initially received a dose of 50.4 Gy in 28 fractions to the breast using whole-breast tangent fields. This was delivered using a 3-D conformal technique. The pt received a boost delivering an additional 10 Gy in 5 fractions using a electron boost with 4mV electrons. The total dose was 60.4 Gy.    09/2019 - 09/2024 Anti-estrogen oral therapy   Tamoxifen     CHIEF COMPLIANT: Follow-up of right breast DCIS on tamoxifen  INTERVAL HISTORY: Heather Floydis a 39y.o. with above-mentioned history of right breast DCIS for which she underwent a lumpectomy, radiation, and is currenly on antiestrogen therapy with tamoxifen. Mammogram on 06/16/20 showed no evidence of malignancy bilaterally. She presents to the clinic today for follow-up.  Mild right-sided breast discomfort otherwise doing quite well.  Tolerating tamoxifen fairly well.  Does have occasional hot flashes but not bothering her significantly.  Recent mammograms and of August were normal.  ALLERGIES:  is allergic to orange oil.  MEDICATIONS:  Current Outpatient Medications  Medication Sig Dispense Refill  . diazepam (VALIUM) 10 MG tablet Take 10 mg by mouth every 6 (six) hours as needed for anxiety (as needed states that she has not taken it).    .Marland KitchenHYDROcodone-acetaminophen (NORCO/VICODIN) 5-325 MG tablet Take 1-2 tablets by mouth every 6 (six) hours as needed for moderate pain or severe pain. (Patient not taking: Reported on 08/06/2019) 15 tablet 0  . tamoxifen (NOLVADEX)  20 MG tablet Take 1 tablet (20 mg total) by mouth daily. 90 tablet 3   No current facility-administered medications for this visit.    PHYSICAL EXAMINATION: ECOG  PERFORMANCE STATUS: 1 - Symptomatic but completely ambulatory  Vitals:   07/18/20 1544  BP: (!) 125/101  Pulse: 75  Resp: 18  Temp: (!) 97.5 F (36.4 C)  SpO2: 100%   Filed Weights   07/18/20 1544  Weight: 199 lb 14.4 oz (90.7 kg)    BREAST: No palpable masses or nodules in either right or left breasts. No palpable axillary supraclavicular or infraclavicular adenopathy no breast tenderness or nipple discharge. (exam performed in the presence of a chaperone)  LABORATORY DATA:  I have reviewed the data as listed CMP Latest Ref Rng & Units 06/24/2019  Glucose 70 - 99 mg/dL 89  BUN 6 - 20 mg/dL 12  Creatinine 0.44 - 1.00 mg/dL 0.79  Sodium 135 - 145 mmol/L 140  Potassium 3.5 - 5.1 mmol/L 3.8  Chloride 98 - 111 mmol/L 105  CO2 22 - 32 mmol/L 27  Calcium 8.9 - 10.3 mg/dL 9.2  Total Protein 6.5 - 8.1 g/dL 7.6  Total Bilirubin 0.3 - 1.2 mg/dL 0.3  Alkaline Phos 38 - 126 U/L 68  AST 15 - 41 U/L 13(L)  ALT 0 - 44 U/L 10    Lab Results  Component Value Date   WBC 6.8 06/24/2019   HGB 13.2 06/24/2019   HCT 39.9 06/24/2019   MCV 86.0 06/24/2019   PLT 352 06/24/2019   NEUTROABS 4.4 06/24/2019    ASSESSMENT & PLAN:  Ductal carcinoma in situ (DCIS) of right breast 06/19/2019:Baseline routine screening mammogram showed two groups of indeterminate calcifications in the UOQ of the right breast, 1.2cm and 0.8cm. 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  07/22/2019:Right lumpectomy Heather Delacruz): high grade DCIS, clear margins.ER PR positive  Treatment plan: 1.Adjuvant radiation therapy 08/27/2019-10/12/2019 2.Adjuvant tamoxifen for 5 years  Tamoxifen toxicities: Very occasional hot flashes and occasional muscle cramps but otherwise tolerating it extremely well. I renewed the prescription for another year.  Breast cancer surveillance: 1.  Breast  exam done by Mendel Ryder: Benign 2. Mammogram 06/16/2020: Benign breast density category C  Return to clinic in 1 year for follow-up    No orders of the defined types were placed in this encounter.  The patient has a good understanding of the overall plan. she agrees with it. she will call with any problems that may develop before the next visit here.  Total time spent: 20 mins including face to face time and time spent for planning, charting and coordination of care  Nicholas Lose, MD 07/18/2020  I, Cloyde Reams Dorshimer, am acting as scribe for Dr. Nicholas Lose.  I have reviewed the above documentation for accuracy and completeness, and I agree with the above.

## 2020-07-18 ENCOUNTER — Other Ambulatory Visit: Payer: Self-pay

## 2020-07-18 ENCOUNTER — Inpatient Hospital Stay: Payer: BC Managed Care – PPO | Attending: Hematology and Oncology | Admitting: Hematology and Oncology

## 2020-07-18 DIAGNOSIS — Z170 Estrogen receptor positive status [ER+]: Secondary | ICD-10-CM | POA: Diagnosis not present

## 2020-07-18 DIAGNOSIS — Z923 Personal history of irradiation: Secondary | ICD-10-CM | POA: Insufficient documentation

## 2020-07-18 DIAGNOSIS — R252 Cramp and spasm: Secondary | ICD-10-CM | POA: Diagnosis not present

## 2020-07-18 DIAGNOSIS — Z79899 Other long term (current) drug therapy: Secondary | ICD-10-CM | POA: Insufficient documentation

## 2020-07-18 DIAGNOSIS — D0511 Intraductal carcinoma in situ of right breast: Secondary | ICD-10-CM | POA: Diagnosis not present

## 2020-07-18 DIAGNOSIS — R232 Flushing: Secondary | ICD-10-CM | POA: Diagnosis not present

## 2020-07-18 DIAGNOSIS — Z9221 Personal history of antineoplastic chemotherapy: Secondary | ICD-10-CM | POA: Insufficient documentation

## 2020-07-18 DIAGNOSIS — Z79810 Long term (current) use of selective estrogen receptor modulators (SERMs): Secondary | ICD-10-CM | POA: Insufficient documentation

## 2020-07-18 MED ORDER — TAMOXIFEN CITRATE 20 MG PO TABS: 20.0000 mg | ORAL_TABLET | Freq: Every day | ORAL | 3 refills | Status: DC

## 2020-07-18 NOTE — Assessment & Plan Note (Signed)
06/19/2019:Baseline routine screening mammogram showed two groups of indeterminate calcifications in the UOQ of the right breast, 1.2cm and 0.8cm. 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  07/22/2019:Right lumpectomy Heather Delacruz): high grade DCIS, clear margins.ER PR positive  Treatment plan: 1.Adjuvant radiation therapy 08/27/2019-10/12/2019 2.Adjuvant tamoxifen for 5 years  Tamoxifen toxicities:  Breast cancer surveillance: 1.  Breast exam 07/18/2020: Benign 2. Mammogram 06/16/2020: Benign breast density category C  Return to clinic in 1 year for follow-up

## 2020-07-19 ENCOUNTER — Telehealth: Payer: Self-pay | Admitting: Hematology and Oncology

## 2020-07-19 NOTE — Telephone Encounter (Signed)
Scheduled per 9/27 los. Called and spoke with pt,confirmed 9/27appt

## 2021-05-15 ENCOUNTER — Other Ambulatory Visit: Payer: Self-pay | Admitting: Family Medicine

## 2021-05-15 DIAGNOSIS — Z1231 Encounter for screening mammogram for malignant neoplasm of breast: Secondary | ICD-10-CM

## 2021-06-14 ENCOUNTER — Other Ambulatory Visit: Payer: Self-pay | Admitting: Family Medicine

## 2021-06-14 DIAGNOSIS — Z853 Personal history of malignant neoplasm of breast: Secondary | ICD-10-CM

## 2021-06-16 ENCOUNTER — Other Ambulatory Visit: Payer: Self-pay | Admitting: Adult Health

## 2021-06-16 DIAGNOSIS — Z853 Personal history of malignant neoplasm of breast: Secondary | ICD-10-CM

## 2021-06-19 ENCOUNTER — Other Ambulatory Visit: Payer: Self-pay

## 2021-06-19 ENCOUNTER — Ambulatory Visit
Admission: RE | Admit: 2021-06-19 | Discharge: 2021-06-19 | Disposition: A | Discharge status: Home / Self Care | Payer: BC Managed Care – PPO | Source: Ambulatory Visit | Attending: Family Medicine | Admitting: Family Medicine

## 2021-06-19 DIAGNOSIS — Z853 Personal history of malignant neoplasm of breast: Secondary | ICD-10-CM

## 2021-07-17 NOTE — Progress Notes (Signed)
Patient Care Team: Nena Polio, NP as PCP - General (Family Medicine) Jovita Kussmaul, MD as Consulting Physician (General Surgery) Nicholas Lose, MD as Consulting Physician (Hematology and Oncology) Kyung Rudd, MD as Consulting Physician (Radiation Oncology)  DIAGNOSIS:    ICD-10-CM   1. Ductal carcinoma in situ (DCIS) of right breast  D05.11       SUMMARY OF ONCOLOGIC HISTORY: Oncology History  Ductal carcinoma in situ (DCIS) of right breast  06/19/2019 Initial Diagnosis   Baseline routine screening mammogram showed two groups of indeterminate calcifications in the UOQ of the right breast, 1.2cm and 0.8cm. 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   07/01/2019 Genetic Testing   Negative genetic testing. No pathogenic variants identified on the [facility] Breast cancer STAT Panel + Common Hereditary Cancers Panel. The STAT Breast cancer panel offered by [facility] includes sequencing and rearrangement analysis for the following 9 genes:  ATM, BRCA1, BRCA2, CDH1, CHEK2, PALB2, PTEN, STK11 and TP53.  The Common Hereditary Cancers Panel offered by [facility] includes sequencing and/or deletion duplication testing of the following 47 genes: APC, ATM, AXIN2, BARD1, BMPR1A, BRCA1, BRCA2, BRIP1, CDH1, CDKN2A (p14ARF), CDKN2A (p16INK4a), CKD4, CHEK2, CTNNA1, DICER1, EPCAM (Deletion/duplication testing only), GREM1 (promoter region deletion/duplication testing only), KIT, MEN1, MLH1, MSH2, MSH3, MSH6, MUTYH, NBN, NF1, NHTL1, PALB2, PDGFRA, PMS2, POLD1, POLE, PTEN, RAD50, RAD51C, RAD51D, SDHB, SDHC, SDHD, SMAD4, SMARCA4. STK11, TP53, TSC1, TSC2, and VHL.  The following genes were evaluated for sequence changes only: SDHA and HOXB13 c.251G>A variant only. The report date is 06/30/2019.   07/22/2019 Surgery   Right lumpectomy Marlou Starks) [phone]): 5.2 cm high grade DCIS, clear  margins.  Complex sclerosing lesion ER 90%, PR 100%. No regional lymph nodes were examined.   07/22/2019 Cancer Staging   Staging form: Breast, AJCC 8th Edition - Pathologic stage from 07/22/2019: Stage 0 (pTis (DCIS), pN0, cM0, ER+, PR+)   08/27/2019 - 10/12/2019 Radiation Therapy   The patient initially received a dose of 50.4 Gy in 28 fractions to the breast using whole-breast tangent fields. This was delivered using a 3-D conformal technique. The pt received a boost delivering an additional 10 Gy in 5 fractions using a electron boost with 66meV electrons. The total dose was 60.4 Gy.    09/2019 - 09/2024 Anti-estrogen oral therapy   Tamoxifen     CHIEF COMPLIANT: Follow-up of right breast DCIS on tamoxifen  INTERVAL HISTORY: Heather Delacruz is a 40 y.o. with above-mentioned history of  right breast DCIS for which she underwent a lumpectomy, radiation, and is currenly on antiestrogen therapy with tamoxifen. Mammogram on 06/19/2021 showed no evidence of malignancy bilaterally. She presents to the clinic today for follow-up.  Very occasional hot flashes but otherwise she is doing quite well.  ALLERGIES:  is allergic to orange oil.  MEDICATIONS:  Current Outpatient Medications  Medication Sig Dispense Refill   tamoxifen (NOLVADEX) 20 MG tablet Take 1 tablet (20 mg total) by mouth daily. 90 tablet 3   No current facility-administered medications for this visit.    PHYSICAL EXAMINATION: ECOG PERFORMANCE STATUS: 0 - Asymptomatic  Vitals:   07/18/21 1440  BP: (!) 168/71  Pulse: 93  Resp: 18  Temp: 97.6 F (36.4 C)  SpO2: 100%   Filed Weights   07/18/21 1440  Weight: 195 lb 9.6 oz (88.7 kg)    BREAST: No  palpable masses or nodules in either right or left breasts. No palpable axillary supraclavicular or infraclavicular adenopathy no breast tenderness or nipple discharge. (exam performed in the presence of a chaperone)  LABORATORY DATA:  I have reviewed the data as  listed CMP Latest Ref Rng & Units 06/24/2019  Glucose 70 - 99 mg/dL 89  BUN 6 - 20 mg/dL 12  Creatinine 0.44 - 1.00 mg/dL 0.79  Sodium 135 - 145 mmol/L 140  Potassium 3.5 - 5.1 mmol/L 3.8  Chloride 98 - 111 mmol/L 105  CO2 22 - 32 mmol/L 27  Calcium 8.9 - 10.3 mg/dL 9.2  Total Protein 6.5 - 8.1 g/dL 7.6  Total Bilirubin 0.3 - 1.2 mg/dL 0.3  Alkaline Phos 38 - 126 U/L 68  AST 15 - 41 U/L 13(L)  ALT 0 - 44 U/L 10    Lab Results  Component Value Date   WBC 6.8 06/24/2019   HGB 13.2 06/24/2019   HCT 39.9 06/24/2019   MCV 86.0 06/24/2019   PLT 352 06/24/2019   NEUTROABS 4.4 06/24/2019    ASSESSMENT & PLAN:  Ductal carcinoma in situ (DCIS) of right breast 06/19/2019:Baseline routine screening mammogram showed two groups of indeterminate calcifications in the UOQ of the right breast, 1.2cm and 0.8cm. 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   07/22/2019: Right lumpectomy Marlou Starks): high grade DCIS, clear margins.  ER PR positive   Treatment plan: 1.  Adjuvant radiation therapy 08/27/2019-10/12/2019 2.  Adjuvant tamoxifen for 5 years   Tamoxifen toxicities: Very occasional hot flashes   I renewed the prescription for another year.   Breast cancer surveillance: 1.  Breast exam  07/18/2021: Benign 2. 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 Return to clinic in 1 year for follow-up    No orders of the defined types were placed in this encounter.  The patient has a good understanding of the overall plan. she agrees with it. she will call with any problems that may develop before the next visit here.  Total time spent: 20 mins including face to face time and time spent for planning, charting and coordination of care  Rulon Eisenmenger, MD, MPH 07/18/2021  I, Thana Ates, am acting  as scribe for Dr. Nicholas Lose.  I have reviewed the above documentation for accuracy and completeness, and I agree with the above.

## 2021-07-18 ENCOUNTER — Other Ambulatory Visit: Payer: Self-pay

## 2021-07-18 ENCOUNTER — Inpatient Hospital Stay: Payer: BC Managed Care – PPO | Attending: Hematology and Oncology | Admitting: Hematology and Oncology

## 2021-07-18 DIAGNOSIS — Z923 Personal history of irradiation: Secondary | ICD-10-CM | POA: Diagnosis not present

## 2021-07-18 DIAGNOSIS — Z79810 Long term (current) use of selective estrogen receptor modulators (SERMs): Secondary | ICD-10-CM | POA: Insufficient documentation

## 2021-07-18 DIAGNOSIS — D0511 Intraductal carcinoma in situ of right breast: Secondary | ICD-10-CM

## 2021-07-18 MED ORDER — BIOTIN 1 MG PO CAPS: 1.0000 | ORAL_CAPSULE | Freq: Every day | ORAL | Status: DC

## 2021-07-18 MED ORDER — ZINC GLUCONATE 50 MG PO TABS: 50.0000 mg | ORAL_TABLET | Freq: Every day | ORAL | Status: DC

## 2021-07-18 MED ORDER — TAMOXIFEN CITRATE 20 MG PO TABS: 20.0000 mg | ORAL_TABLET | Freq: Every day | ORAL | 3 refills | Status: DC

## 2021-07-18 MED ORDER — VITAMIN D 25 MCG (1000 UNIT) PO TABS: 1000.0000 [IU] | ORAL_TABLET | Freq: Every day | ORAL | Status: DC

## 2021-07-18 NOTE — Assessment & Plan Note (Signed)
06/19/2019:Baseline routine screening mammogram showed two groups of indeterminate calcifications in the UOQ of the right breast, 1.2cm and 0.8cm. 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  07/22/2019:Right lumpectomy Marlou Starks): high grade DCIS, clear margins.ER PR positive  Treatment plan: 1.Adjuvant radiation therapy11/02/2019-10/12/2019 2.Adjuvant tamoxifen for 5 years  Tamoxifen toxicities: Very occasional hot flashes and occasional muscle cramps but otherwise tolerating it extremely well. I renewed the prescription for another year.  Breast cancer surveillance: 1.  Breast exam  07/18/2021: Benign 2. Mammogram 06/19/2021: Benign breast density category C  Return to clinic in 1 year for follow-up
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
## 2021-11-09 ENCOUNTER — Ambulatory Visit: Payer: BC Managed Care – PPO | Admitting: Internal Medicine

## 2021-11-09 ENCOUNTER — Encounter: Payer: Self-pay | Admitting: Internal Medicine

## 2021-11-09 ENCOUNTER — Other Ambulatory Visit: Payer: Self-pay

## 2021-11-09 VITALS — BP 136/98 | HR 82 | Temp 98.0°F | Resp 16 | Ht 65.5 in | Wt 200.0 lb

## 2021-11-09 DIAGNOSIS — J302 Other seasonal allergic rhinitis: Secondary | ICD-10-CM

## 2021-11-09 DIAGNOSIS — J310 Chronic rhinitis: Secondary | ICD-10-CM | POA: Diagnosis not present

## 2021-11-09 DIAGNOSIS — J3089 Other allergic rhinitis: Secondary | ICD-10-CM

## 2021-11-09 DIAGNOSIS — L2084 Intrinsic (allergic) eczema: Secondary | ICD-10-CM | POA: Diagnosis not present

## 2021-11-09 DIAGNOSIS — T781XXA Other adverse food reactions, not elsewhere classified, initial encounter: Secondary | ICD-10-CM | POA: Insufficient documentation

## 2021-11-09 MED ORDER — AZELASTINE HCL 0.1 % NA SOLN: 1.0000 | Freq: Two times a day (BID) | NASAL | 5 refills | Status: DC | PRN

## 2021-11-09 NOTE — Patient Instructions (Addendum)
Chronic Rhinitis - allergic seasonal and perennial: - allergy testing today was positive ot grasses, ragweed, weed mix, tree mix, indoor and outdoor molds, cockroach - allergen avoidance as below - consider allergy shots as long term control of your symptoms by teaching your immune system to be more tolerant of your allergy triggers - Start Nasal Steroid Spray: Options include Flonase (fluticasone), Nasocort (triamcinolone), Nasonex (mometasome) 1- 2 sprays in each nostril daily (can buy over-the-counter if not covered by insurance)  Best results if used daily. - Start Astelin (Azelastine) 1-2 sprays in each nostril twice a day as needed.  You may use this as needed for nasal congestion/itchy ears/itchy nose if desired.  - Continue Singulair (Montelukast) 10mg  daily. - Continue over the counter antihistamine daily or daily as needed.   -Your options include Zyrtec (Cetirizine) 10mg , Claritin (Loratadine) 10mg , Allegra (Fexofenadine) 180mg , or Xyzal (Levocetirinze) 5mg   Atopic Dermatitis:  - managed by Dermatology Daily Care For Maintenance (daily and continue even once eczema controlled) - Use hypoallergenic hydrating ointment at least twice daily.  This must be done daily for control of flares. 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 It was a pleasure meeting you in clinic today!   Sigurd Sos, MD [facility] ------------------------------------------ Reducing Pollen Exposure  The American  Academy of Allergy, Asthma and Immunology suggests the following steps to reduce your exposure to pollen during allergy seasons.    Do not hang sheets or clothing out to dry; pollen may collect on these items. Do not mow lawns or spend time around freshly cut grass; mowing stirs up pollen. Keep windows closed at night.  Keep car windows closed while driving. Minimize morning activities outdoors, a time when pollen counts are usually at their highest. Stay indoors as much as possible when pollen counts or humidity is high and on windy days when pollen tends to remain in the air longer. Use air conditioning when possible.  Many air conditioners have filters that trap the pollen spores. Use a HEPA room air filter to remove pollen form the indoor air you breathe.  Control of Mold Allergen   Mold and fungi can grow on a variety of surfaces provided certain temperature and moisture conditions exist.  Outdoor molds grow on plants, decaying vegetation and soil.  The major outdoor mold, Alternaria and Cladosporium, are found in very high numbers during hot and dry conditions.  Generally, a late Summer - Fall peak is seen for common outdoor fungal spores.  Rain will temporarily lower outdoor mold spore count, but counts rise rapidly when the rainy period ends.  The most important indoor molds are Aspergillus and Penicillium.  Dark, humid and poorly ventilated basements are ideal sites for mold growth.  The next most common sites of mold growth are the bathroom and the kitchen.  Outdoor (Seasonal) Mold Control  Use air conditioning and keep windows closed Avoid exposure to decaying vegetation. Avoid leaf raking. Avoid grain handling. Consider wearing a face mask if working in moldy areas.  Indoor (Perennial) Mold Control   Maintain humidity below 50%. Clean washable surfaces with 5% bleach solution. Remove sources e.g. contaminated carpets.  Control of Cockroach Allergen  Cockroach allergen has  been identified as an important cause of acute attacks of asthma, especially in urban settings.  There are fifty-five species of cockroach that exist in the [place], however only three, the [place], [place] species produce allergen that can affect patients with Asthma.  Allergens can be obtained from fecal particles, egg casings and secretions from cockroaches.    Remove food sources. Reduce access to water. Seal access and entry points. Spray runways with 0.5-1% Diazinon or Chlorpyrifos Blow boric acid power under stoves and refrigerator. Place bait stations (hydramethylnon) at feeding sites.

## 2022-03-05 ENCOUNTER — Other Ambulatory Visit: Payer: Self-pay | Admitting: Adult Health

## 2022-03-05 DIAGNOSIS — Z853 Personal history of malignant neoplasm of breast: Secondary | ICD-10-CM

## 2022-05-31 ENCOUNTER — Other Ambulatory Visit: Payer: Self-pay | Admitting: Hematology and Oncology

## 2022-06-21 ENCOUNTER — Ambulatory Visit
Admission: RE | Admit: 2022-06-21 | Discharge: 2022-06-21 | Disposition: A | Discharge status: Home / Self Care | Payer: BC Managed Care – PPO | Source: Ambulatory Visit | Attending: Adult Health | Admitting: Adult Health

## 2022-06-21 DIAGNOSIS — Z853 Personal history of malignant neoplasm of breast: Secondary | ICD-10-CM

## 2022-07-16 NOTE — Progress Notes (Signed)
Patient Care Team: Primus Bravo, NP as PCP - General (Family Medicine) Griselda Miner, MD as Consulting Physician (General Surgery) Serena Croissant, MD as Consulting Physician (Hematology and Oncology) Dorothy Puffer, MD as Consulting Physician (Radiation Oncology)  DIAGNOSIS: No diagnosis found.  SUMMARY OF ONCOLOGIC HISTORY: Oncology History  Ductal carcinoma in situ (DCIS) of right breast  06/19/2019 Initial Diagnosis   Baseline routine screening mammogram showed two groups of indeterminate calcifications in the UOQ of the right breast, 1.2cm and 0.8cm. 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   07/01/2019 Genetic Testing   Negative genetic testing. No pathogenic variants identified on the [facility] Breast cancer STAT Panel + Common Hereditary Cancers Panel. The STAT Breast cancer panel offered by [facility] includes sequencing and rearrangement analysis for the following 9 genes:  ATM, BRCA1, BRCA2, CDH1, CHEK2, PALB2, PTEN, STK11 and TP53.  The Common Hereditary Cancers Panel offered by [facility] includes sequencing and/or deletion duplication testing of the following 47 genes: APC, ATM, AXIN2, BARD1, BMPR1A, BRCA1, BRCA2, BRIP1, CDH1, CDKN2A (p14ARF), CDKN2A (p16INK4a), CKD4, CHEK2, CTNNA1, DICER1, EPCAM (Deletion/duplication testing only), GREM1 (promoter region deletion/duplication testing only), KIT, MEN1, MLH1, MSH2, MSH3, MSH6, MUTYH, NBN, NF1, NHTL1, PALB2, PDGFRA, PMS2, POLD1, POLE, PTEN, RAD50, RAD51C, RAD51D, SDHB, SDHC, SDHD, SMAD4, SMARCA4. STK11, TP53, TSC1, TSC2, and VHL.  The following genes were evaluated for sequence changes only: SDHA and HOXB13 c.251G>A variant only. The report date is 06/30/2019.   07/22/2019 Surgery   Right lumpectomy Carolynne Edouard) [phone]): 5.2 cm high grade DCIS, clear margins.  Complex sclerosing lesion ER 90%, PR 100%. No regional  lymph nodes were examined.   07/22/2019 Cancer Staging   Staging form: Breast, AJCC 8th Edition - Pathologic stage from 07/22/2019: Stage 0 (pTis (DCIS), pN0, cM0, ER+, PR+)   08/27/2019 - 10/12/2019 Radiation Therapy   The patient initially received a dose of 50.4 Gy in 28 fractions to the breast using whole-breast tangent fields. This was delivered using a 3-D conformal technique. The pt received a boost delivering an additional 10 Gy in 5 fractions using a electron boost with electrons. The total dose was 60.4 Gy.    09/2019 - 09/2024 Anti-estrogen oral therapy   Tamoxifen     CHIEF COMPLIANT: Follow-up of right breast DCIS on tamoxifen  INTERVAL HISTORY: Heather Delacruz is a 41 y.o. with the above mentioned. She presents to the clinic for a follow-up.    ALLERGIES:  is allergic to orange oil.  MEDICATIONS:  Current Outpatient Medications  Medication Sig Dispense Refill   azelastine (ASTELIN) 0.1 % nasal spray Place 1-2 sprays into both nostrils 2 (two) times daily as needed. 30 mL 5   Biotin 1 MG CAPS Take 1 capsule by mouth daily. 30 capsule    cholecalciferol (VITAMIN D3) 25 MCG (1000 UNIT) tablet Take 1 tablet (1,000 Units total) by mouth daily.     clobetasol ointment (TEMOVATE) 0.05 % SMARTSIG:1 Topical Daily     Fluocinolone Acetonide Body 0.01 % OIL Apply topically.     levocetirizine (XYZAL) 5 MG tablet SMARTSIG:1 Tablet(s) By Mouth Every Evening     montelukast (SINGULAIR) 10 MG tablet Take 10 mg by mouth at bedtime.     tamoxifen (NOLVADEX) 20 MG tablet TAKE 1 TABLET BY MOUTH EVERY DAY 90 tablet 3   zinc gluconate 50 MG tablet Take 1 tablet (50  mg total) by mouth daily.     No current facility-administered medications for this visit.    PHYSICAL EXAMINATION: ECOG PERFORMANCE STATUS: {CHL ONC ECOG PS:[id number]}  There were no vitals filed for this visit. There were no vitals filed for this visit.  BREAST:*** No palpable masses or nodules in either  right or left breasts. No palpable axillary supraclavicular or infraclavicular adenopathy no breast tenderness or nipple discharge. (exam performed in the presence of a chaperone)  LABORATORY DATA:  I have reviewed the data as listed    Latest Ref Rng & Units 06/24/2019   12:29 PM  CMP  Glucose 70 - 99 mg/dL 89   BUN 6 - 20 mg/dL 12   Creatinine 0.44 - 1.00 mg/dL 0.79   Sodium 135 - 145 mmol/L 140   Potassium 3.5 - 5.1 mmol/L 3.8   Chloride 98 - 111 mmol/L 105   CO2 22 - 32 mmol/L 27   Calcium 8.9 - 10.3 mg/dL 9.2   Total Protein 6.5 - 8.1 g/dL 7.6   Total Bilirubin 0.3 - 1.2 mg/dL 0.3   Alkaline Phos 38 - 126 U/L 68   AST 15 - 41 U/L 13   ALT 0 - 44 U/L 10     Lab Results  Component Value Date   WBC 6.8 06/24/2019   HGB 13.2 06/24/2019   HCT 39.9 06/24/2019   MCV 86.0 06/24/2019   PLT 352 06/24/2019   NEUTROABS 4.4 06/24/2019    ASSESSMENT & PLAN:  No problem-specific Assessment & Plan notes found for this encounter.    No orders of the defined types were placed in this encounter.  The patient has a good understanding of the overall plan. she agrees with it. she will call with any problems that may develop before the next visit here. Total time spent: 30 mins including face to face time and time spent for planning, charting and co-ordination of care   Suzzette Righter, [place] 07/16/22    I Gardiner Coins am scribing for Dr. Lindi Adie  ***

## 2022-07-19 ENCOUNTER — Inpatient Hospital Stay: Payer: BC Managed Care – PPO | Attending: Hematology and Oncology | Admitting: Hematology and Oncology

## 2022-07-19 DIAGNOSIS — Z79810 Long term (current) use of selective estrogen receptor modulators (SERMs): Secondary | ICD-10-CM | POA: Diagnosis not present

## 2022-07-19 DIAGNOSIS — D0511 Intraductal carcinoma in situ of right breast: Secondary | ICD-10-CM

## 2022-07-19 DIAGNOSIS — Z86000 Personal history of in-situ neoplasm of breast: Secondary | ICD-10-CM | POA: Diagnosis present

## 2022-07-19 NOTE — Assessment & Plan Note (Addendum)
06/19/2019:Baseline routine screening mammogram showed two groups of indeterminate calcifications in the UOQ of the right breast, 1.2cm and 0.8cm. 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  07/22/2019:Right lumpectomy Marlou Starks): high grade DCIS, clear margins.ER PR positive  Treatment plan: 1.Adjuvant radiation therapy11/02/2019-10/12/2019 2.Adjuvant tamoxifen for 5 years  Tamoxifentoxicities: Very occasional hot flashes    Uneven breast sizes: I sent a referral to Dr. Iran Planas out to evaluate her for plastic surgery options.  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  Return to clinic in 1 year for follow-up

## 2022-08-10 ENCOUNTER — Other Ambulatory Visit: Payer: Self-pay | Admitting: Orthopedic Surgery

## 2022-08-23 NOTE — Patient Instructions (Addendum)
SURGICAL WAITING ROOM VISITATION Patients having surgery or a procedure may have no more than 2 support people in the waiting area - these visitors may rotate.   Children under the age of 3 must have an adult with them who is not the patient. If the patient needs to stay at the hospital during part of their recovery, the visitor guidelines for inpatient rooms apply. Pre-op nurse will coordinate an appropriate time for 1 support person to accompany patient in pre-op.  This support person may not rotate.    Please refer to the [facility] website for the visitor guidelines for Inpatients (after your surgery is over and you are in a regular room).      Your procedure is scheduled on: 09-03-22   Report to [facility] Main Entrance    Report to admitting at 5:15 AM   Call this number if you have problems the morning of surgery [phone]   Do not eat food :After Midnight.   After Midnight you may have the following liquids until 4:15 AM DAY OF SURGERY  Water Non-Citrus Juices (without pulp, NO RED) Carbonated Beverages Black Coffee (NO MILK/CREAM OR CREAMERS, sugar ok)  Clear Tea (NO MILK/CREAM OR CREAMERS, sugar ok) regular and decaf                             Plain Jell-O (NO RED)                                           Fruit ices (not with fruit pulp, NO RED)                                     Popsicles (NO RED)                                                               Sports drinks like Gatorade (NO RED)                   The day of surgery:  Drink ONE (1) Pre-Surgery Clear Ensure at 4:15 AM the morning of surgery. Drink in one sitting. Do not sip.  This drink was given to you during your hospital  pre-op appointment visit. Nothing else to drink after completing the Pre-Surgery Clear Ensure           If you have questions, please contact your surgeon's office.   FOLLOW ANY ADDITIONAL PRE OP INSTRUCTIONS YOU RECEIVED FROM YOUR SURGEON'S OFFICE!!!      Oral Hygiene is also important to reduce your risk of infection.                                    Remember - BRUSH YOUR TEETH THE MORNING OF SURGERY WITH YOUR REGULAR TOOTHPASTE   Do NOT smoke after Midnight   Take these medicines the morning of surgery with A SIP OF WATER:   Xyzal  You may not have any metal on your body including hair pins, jewelry, and body piercing             Do not wear make-up, lotions, powders, perfumes or deodorant  Do not wear nail polish including gel and S&S, artificial/acrylic nails, or any other type of covering on natural nails including finger and toenails. If you have artificial nails, gel coating, etc. that needs to be removed by a nail salon please have this removed prior to surgery or surgery may need to be canceled/ delayed if the surgeon/ anesthesia feels like they are unable to be safely monitored.   Do not shave  48 hours prior to surgery.            Do not bring valuables to the hospital. [place].   Contacts, dentures or bridgework may not be worn into surgery.   DO NOT [place]. PHARMACY WILL DISPENSE MEDICATIONS LISTED ON YOUR MEDICATION LIST TO YOU DURING YOUR ADMISSION Heather Delacruz!    Patients discharged on the day of surgery will not be allowed to drive home.  Someone NEEDS to stay with you for the first 24 hours after anesthesia.              Please read over the following fact sheets you were given: IF Heather Delacruz  If you received a COVID test during your pre-op visit  it is requested that you wear a mask when out in public, stay away from anyone that may not be feeling well and notify your surgeon if you develop symptoms. If you test positive for Covid or have been in contact with anyone that has tested positive in the last 10 days please notify you surgeon.  [facility] - Preparing for Surgery Before surgery, you can play an important role.  Because skin is not sterile, your skin needs to be as free of germs as possible.  You can reduce the number of germs on your skin by washing with CHG (chlorahexidine gluconate) soap before surgery.  CHG is an antiseptic cleaner which kills germs and bonds with the skin to continue killing germs even after washing. Please DO NOT use if you have an allergy to CHG or antibacterial soaps.  If your skin becomes reddened/irritated stop using the CHG and inform your nurse when you arrive at Short Stay. Do not shave (including legs and underarms) for at least 48 hours prior to the first CHG shower.  You may shave your face/neck.  Please follow these instructions carefully:  1.  Shower with CHG Soap the night before surgery and the  morning of surgery.  2.  If you choose to wash your hair, wash your hair first as usual with your normal  shampoo.  3.  After you shampoo, rinse your hair and body thoroughly to remove the shampoo.                             4.  Use CHG as you would any other liquid soap.  You can apply chg directly to the skin and wash.  Gently with a scrungie or clean washcloth.  5.  Apply the CHG Soap to your body ONLY FROM THE NECK DOWN.   Do   not use on face/ open  Wound or open sores. Avoid contact with eyes, ears mouth and   genitals (private parts).                       Wash face,  Genitals (private parts) with your normal soap.             6.  Wash thoroughly, paying special attention to the area where your    surgery  will be performed.  7.  Thoroughly rinse your body with warm water from the neck down.  8.  DO NOT shower/wash with your normal soap after using and rinsing off the CHG Soap.                9.  Pat yourself dry with a clean towel.            10.  Wear clean pajamas.            11.  Place clean sheets on your bed the night of your first shower and do not  sleep with  pets. Day of Surgery : Do not apply any lotions/deodorants the morning of surgery.  Please wear clean clothes to the hospital/surgery center.  FAILURE TO FOLLOW THESE INSTRUCTIONS MAY RESULT IN THE CANCELLATION OF YOUR SURGERY  PATIENT SIGNATURE_________________________________  NURSE SIGNATURE__________________________________  ________________________________________________________________________    Heather Delacruz  An incentive spirometer is a tool that can help keep your lungs clear and active. This tool measures how well you are filling your lungs with each breath. Taking long deep breaths may help reverse or decrease the chance of developing breathing (pulmonary) problems (especially infection) following: A long period of time when you are unable to move or be active. BEFORE THE PROCEDURE  If the spirometer includes an indicator to show your best effort, your nurse or respiratory therapist will set it to a desired goal. If possible, sit up straight or lean slightly forward. Try not to slouch. Hold the incentive spirometer in an upright position. INSTRUCTIONS FOR USE  Sit on the edge of your bed if possible, or sit up as far as you can in bed or on a chair. Hold the incentive spirometer in an upright position. Breathe out normally. Place the mouthpiece in your mouth and seal your lips tightly around it. Breathe in slowly and as deeply as possible, raising the piston or the ball toward the top of the column. Hold your breath for 3-5 seconds or for as long as possible. Allow the piston or ball to fall to the bottom of the column. Remove the mouthpiece from your mouth and breathe out normally. Rest for a few seconds and repeat Steps 1 through 7 at least 10 times every 1-2 hours when you are awake. Take your time and take a few normal breaths between deep breaths. The spirometer may include an indicator to show your best effort. Use the indicator as a goal to work toward during  each repetition. After each set of 10 deep breaths, practice coughing to be sure your lungs are clear. If you have an incision (the cut made at the time of surgery), support your incision when coughing by placing a pillow or rolled up towels firmly against it. Once you are able to get out of bed, walk around indoors and cough well. You may stop using the incentive spirometer when instructed by your caregiver.  RISKS AND COMPLICATIONS Take your time so you do not get dizzy or light-headed. If you are in pain,  you may need to take or ask for pain medication before doing incentive spirometry. It is harder to take a deep breath if you are having pain. AFTER USE Rest and breathe slowly and easily. It can be helpful to keep track of a log of your progress. Your caregiver can provide you with a simple table to help with this. If you are using the spirometer at home, follow these instructions: [place] IF:  You are having difficultly using the spirometer. You have trouble using the spirometer as often as instructed. Your pain medication is not giving enough relief while using the spirometer. You develop fever of 100.5 F (38.1 C) or higher. SEEK IMMEDIATE MEDICAL CARE IF:  You cough up bloody sputum that had not been present before. You develop fever of 102 F (38.9 C) or greater. You develop worsening pain at or near the incision site. MAKE SURE YOU:  Understand these instructions. Will watch your condition. Will get help right away if you are not doing well or get worse. Document Released: 02/18/2007 Document Revised: 12/31/2011 Document Reviewed: 04/21/2007 ExitCare Patient Information 2014 Exit[employer], [place].   ________________________________________________________________________ WHAT IS A BLOOD TRANSFUSION? Blood Transfusion Information  A transfusion is the replacement of blood or some of its parts. Blood is made up of multiple cells which provide different functions. Red  blood cells carry oxygen and are used for blood loss replacement. White blood cells fight against infection. Platelets control bleeding. Plasma helps clot blood. Other blood products are available for specialized needs, such as hemophilia or other clotting disorders. BEFORE THE TRANSFUSION  Who gives blood for transfusions?  Healthy volunteers who are fully evaluated to make sure their blood is safe. This is blood bank blood. Transfusion therapy is the safest it has ever been in the practice of medicine. Before blood is taken from a donor, a complete history is taken to make sure that person has no history of diseases nor engages in risky social behavior (examples are intravenous drug use or sexual activity with multiple partners). The donor's travel history is screened to minimize risk of transmitting infections, such as malaria. The donated blood is tested for signs of infectious diseases, such as HIV and hepatitis. The blood is then tested to be sure it is compatible with you in order to minimize the chance of a transfusion reaction. If you or a relative donates blood, this is often done in anticipation of surgery and is not appropriate for emergency situations. It takes many days to process the donated blood. RISKS AND COMPLICATIONS Although transfusion therapy is very safe and saves many lives, the main dangers of transfusion include:  Getting an infectious disease. Developing a transfusion reaction. This is an allergic reaction to something in the blood you were given. Every precaution is taken to prevent this. The decision to have a blood transfusion has been considered carefully by your caregiver before blood is given. Blood is not given unless the benefits outweigh the risks. AFTER THE TRANSFUSION Right after receiving a blood transfusion, you will usually feel much better and more energetic. This is especially true if your red blood cells have gotten low (anemic). The transfusion raises the  level of the red blood cells which carry oxygen, and this usually causes an energy increase. The nurse administering the transfusion will monitor you carefully for complications. HOME CARE INSTRUCTIONS  No special instructions are needed after a transfusion. You may find your energy is better. Speak with your caregiver about any limitations on  activity for underlying diseases you may have. SEEK MEDICAL CARE IF:  Your condition is not improving after your transfusion. You develop redness or irritation at the intravenous (IV) site. SEEK IMMEDIATE MEDICAL CARE IF:  Any of the following symptoms occur over the next 12 hours: Shaking chills. You have a temperature by mouth above 102 F (38.9 C), not controlled by medicine. Chest, back, or muscle pain. People around you feel you are not acting correctly or are confused. Shortness of breath or difficulty breathing. Dizziness and fainting. You get a rash or develop hives. You have a decrease in urine output. Your urine turns a dark color or changes to pink, red, or brown. Any of the following symptoms occur over the next 10 days: You have a temperature by mouth above 102 F (38.9 C), not controlled by medicine. Shortness of breath. Weakness after normal activity. The white part of the eye turns yellow (jaundice). You have a decrease in the amount of urine or are urinating less often. Your urine turns a dark color or changes to pink, red, or brown. Document Released: 10/05/2000 Document Revised: 12/31/2011 Document Reviewed: 05/24/2008 [facility] Patient Information 2014 [place], [place].  _______________________________________________________________________

## 2022-08-23 NOTE — Progress Notes (Addendum)
COVID Vaccine Completed:  Yes  Date of COVID positive in last 90 days:  No  PCP - [facility] Cardiologist - N/A  Chest x-ray - 08-28-22 Epic EKG - 08-28-22 Epic Stress Test -  N/A ECHO -  N/A Cardiac Cath -  N/A Pacemaker/ICD device last checked: Spinal Cord Stimulator: N/A  Bowel Prep -  N/A  Sleep Study - N/A CPAP -   Fasting Blood Sugar -  N/A Checks Blood Sugar _____ times a day  Blood Thinner Instructions: N/A Aspirin Instructions: Last Dose:  Activity level:  Can go up a flight of stairs and perform activities of daily living without stopping and without symptoms of chest pain or shortness of breath.  Able to exercise without symptoms  Anesthesia review:  N/A  Patient denies shortness of breath, fever, cough and chest pain at PAT appointment  Patient verbalized understanding of instructions that were given to them at the PAT appointment. Patient was also instructed that they will need to review over the PAT instructions again at home before surgery.

## 2022-08-28 ENCOUNTER — Encounter (HOSPITAL_COMMUNITY)
Admission: RE | Admit: 2022-08-28 | Discharge: 2022-08-28 | Disposition: A | Discharge status: Home / Self Care | Payer: No Typology Code available for payment source | Source: Ambulatory Visit | Attending: Orthopedic Surgery | Admitting: Orthopedic Surgery

## 2022-08-28 ENCOUNTER — Encounter (HOSPITAL_COMMUNITY): Payer: Self-pay

## 2022-08-28 ENCOUNTER — Other Ambulatory Visit: Payer: Self-pay

## 2022-08-28 ENCOUNTER — Ambulatory Visit (HOSPITAL_COMMUNITY)
Admission: RE | Admit: 2022-08-28 | Discharge: 2022-08-28 | Disposition: A | Discharge status: Home / Self Care | Payer: BC Managed Care – PPO | Source: Ambulatory Visit | Attending: Orthopedic Surgery | Admitting: Orthopedic Surgery

## 2022-08-28 VITALS — BP 132/90 | HR 69 | Temp 98.8°F | Resp 12 | Ht 65.0 in | Wt 161.8 lb

## 2022-08-28 DIAGNOSIS — Z01818 Encounter for other preprocedural examination: Secondary | ICD-10-CM | POA: Insufficient documentation

## 2022-08-28 HISTORY — DX: Other specified postprocedural states: Z98.890

## 2022-08-28 HISTORY — DX: Pneumonia, unspecified organism: J18.9

## 2022-08-28 HISTORY — DX: Unspecified osteoarthritis, unspecified site: M19.90

## 2022-08-28 HISTORY — DX: Other specified postprocedural states: R11.2

## 2022-08-28 LAB — CBC
HCT: 37 % (ref 36.0–46.0)
Hemoglobin: 12 g/dL (ref 12.0–15.0)
MCH: 28.4 pg (ref 26.0–34.0)
MCHC: 32.4 g/dL (ref 30.0–36.0)
MCV: 87.5 fL (ref 80.0–100.0)
Platelets: 235 10*3/uL (ref 150–400)
RBC: 4.23 MIL/uL (ref 3.87–5.11)
RDW: 13.8 % (ref 11.5–15.5)
WBC: 4.5 10*3/uL (ref 4.0–10.5)
nRBC: 0 % (ref 0.0–0.2)

## 2022-08-28 LAB — SURGICAL PCR SCREEN
MRSA, PCR: NEGATIVE
Staphylococcus aureus: NEGATIVE

## 2022-08-29 DIAGNOSIS — M1612 Unilateral primary osteoarthritis, left hip: Secondary | ICD-10-CM | POA: Diagnosis present

## 2022-08-29 NOTE — H&P (Signed)
TOTAL HIP ADMISSION H&P  Patient is admitted for left total hip arthroplasty.  Subjective:  Chief Complaint: left hip pain  HPI: Heather Delacruz, 41 y.o. female, has a history of pain and functional disability in the left hip(s) due to arthritis and patient has failed non-surgical conservative treatments for greater than 12 weeks to include NSAID's and/or analgesics, use of assistive devices, weight reduction as appropriate, and activity modification.  Onset of symptoms was gradual starting >10 years ago with gradually worsening course since that time.The patient noted no past surgery on the left hip(s).  Patient currently rates pain in the left hip at 10 out of 10 with activity. Patient has night pain, worsening of pain with activity and weight bearing, trendelenberg gait, pain that interfers with activities of daily living, and pain with passive range of motion. Patient has evidence of subchondral cysts and joint space narrowing by imaging studies. This condition presents safety issues increasing the risk of falls. There is no current active infection.  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   Bone spur of acromioclavicular joint 02/10/2013   Eczema 02/10/2013   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   Past Medical History:  Diagnosis Date   Arthritis    Breast cancer ([place])    Cancer ([place])    right breast cancer   Eczema    Family history of breast cancer     Family history of prostate cancer    Personal history of radiation therapy 08/06/2019   stopped dec 21,21   Pneumonia    PONV (postoperative nausea and vomiting)     Past Surgical History:  Procedure Laterality Date   ANKLE ARTHROSCOPY     BREAST LUMPECTOMY Right    dcis 07/2019   BREAST LUMPECTOMY WITH RADIOACTIVE SEED LOCALIZATION Right 07/22/2019   Procedure: RIGHT BREAST LUMPECTOMY WITH BRACKETED RADIOACTIVE SEED LOCALIZATION;  Surgeon: Jovita Kussmaul, MD;  Location: [facility];  Service: General;  Laterality: Right;   HIP SURGERY Bilateral    SHOULDER SURGERY Right    TARSAL TUNNEL RELEASE     THROAT SURGERY     Vocal cord nodules removed    No current facility-administered medications for this encounter.   Current Outpatient Medications  Medication Sig Dispense Refill Last Dose   azelastine (ASTELIN) 0.1 % nasal spray Place 1-2 sprays into both nostrils 2 (two) times daily as needed. (Patient taking differently: Place 1-2 sprays into both nostrils 2 (two) times daily as needed for allergies.) 30 mL 5    cholecalciferol (VITAMIN D3) 25 MCG (1000 UNIT) tablet Take 1 tablet (1,000 Units total) by mouth daily.      Fluocinolone Acetonide Body 0.01 % OIL Apply 1 Application topically daily as needed (allergic reaction).      levocetirizine (XYZAL) 5 MG tablet Take 5 mg by mouth daily as needed for allergies.      levonorgestrel (MIRENA) 20 MCG/DAY IUD 1 each by  Intrauterine route once.      montelukast (SINGULAIR) 10 MG tablet Take 10 mg by mouth at bedtime as needed (allergies).      tamoxifen (NOLVADEX) 20 MG tablet TAKE 1 TABLET BY MOUTH EVERY DAY 90 tablet 3    zinc gluconate 50 MG tablet Take 1 tablet (50 mg total) by mouth daily.      Allergies  Allergen Reactions   Orange Oil Itching and Other (See Comments)    Contact Dermatitis     Social History   Tobacco Use   Smoking status: Never   Smokeless tobacco: Never  Substance Use Topics   Alcohol  use: Yes    Comment: social    Family History  Problem Relation Age of Onset   Prostate cancer Father    Cervical cancer Sister    Breast cancer Paternal Aunt        dx 58s   Prostate cancer Paternal Uncle        dx 75s   Esophageal cancer Maternal Grandfather    Breast cancer Paternal Grandmother    Asthma Niece    Allergic rhinitis Neg Hx    Eczema Neg Hx    Immunodeficiency Neg Hx    Atopy Neg Hx    Angioedema Neg Hx      Review of Systems  Constitutional: Negative.   HENT: Negative.    Eyes: Negative.   Respiratory: Negative.    Cardiovascular:  Positive for leg swelling.  Gastrointestinal:  Positive for constipation.  Endocrine: Negative.   Genitourinary: Negative.   Musculoskeletal:  Positive for arthralgias and myalgias.  Skin:  Positive for rash.  Neurological: Negative.   Hematological: Negative.   Psychiatric/Behavioral: Negative.      Objective:  Physical Exam Constitutional:      Appearance: Normal appearance. She is normal weight.  HENT:     Head: Normocephalic.     Nose: Nose normal.  Eyes:     Pupils: Pupils are equal, round, and reactive to light.  Cardiovascular:     Pulses: Normal pulses.  Pulmonary:     Effort: Pulmonary effort is normal.  Musculoskeletal:     Cervical back: Normal range of motion and neck supple.     Comments: Both hips are highly irritable to internal rotation past 10.  Flexion is limited to 110 abduction to 40.  Foot tap is positive on the left negative on the right.  When you flex her past 90 on the left side it reproduces her pain 100 on the right.  Toes are pink and well perfused she is neurovascular intact her musculature about the hip are functioning well.  Neurological:     General: No focal deficit present.     Mental Status: She is alert and oriented to person, place, and time. Mental status is at baseline.  Psychiatric:        Mood and Affect: Mood normal.        Behavior: Behavior normal.        Thought  Content: Thought content normal.        Judgment: Judgment normal.     Vital signs in last 24 hours:    Labs:   Estimated body mass index is 26.92 kg/m as calculated from the following:   Height as of 08/28/22: '5\' 5"'$  (1.651 m).   Weight as of 08/28/22: 73.4 kg.   Imaging Review Plain radiographs demonstrate  AP pelvis and crosstable lateral shows large peripheral osteophytes especially inferiorly where she is  bone-on-bone and posteriorly where she is bone-on-bone.  There are also subchondral cysts in the acetabulum and femoral head bilaterally.   Assessment/Plan:  End stage arthritis, left hip(s)  The patient history, physical examination, clinical judgement of the provider and imaging studies are consistent with end stage degenerative joint disease of the left hip(s) and total hip arthroplasty is deemed medically necessary. The treatment options including medical management, injection therapy, arthroscopy and arthroplasty were discussed at length. The risks and benefits of total hip arthroplasty were presented and reviewed. The risks due to aseptic loosening, infection, stiffness, dislocation/subluxation,  thromboembolic complications and other imponderables were discussed.  The patient acknowledged the explanation, agreed to proceed with the plan and consent was signed. Patient is being admitted for inpatient treatment for surgery, pain control, PT, OT, prophylactic antibiotics, VTE prophylaxis, progressive ambulation and ADL's and discharge planning.The patient is planning to be discharged home with home health services    Patient's anticipated LOS is less than 2 midnights, meeting these requirements: - Younger than 52 - Lives within 1 hour of care - Has a competent adult at home to recover with post-op recover - NO history of  - Chronic pain requiring opiods  - Diabetes  - Coronary Artery Disease  - Heart failure  - Heart attack  - Stroke  - DVT/VTE  - Cardiac  arrhythmia  - Respiratory Failure/COPD  - Renal failure  - Anemia  - Advanced Liver disease

## 2022-08-31 ENCOUNTER — Other Ambulatory Visit: Payer: Self-pay | Admitting: Hematology and Oncology

## 2022-08-31 DIAGNOSIS — R911 Solitary pulmonary nodule: Secondary | ICD-10-CM

## 2022-08-31 NOTE — Progress Notes (Signed)
Telephone call I spoke with the patient about the chest x-ray showing a nodular opacity in the right superior mediastinal area. We or ordering a CT chest next week and I will discuss with her about the result 2 days later.

## 2022-09-03 ENCOUNTER — Encounter (HOSPITAL_COMMUNITY)
Admission: RE | Disposition: A | Discharge status: Home / Self Care | Payer: Self-pay | Source: Home / Self Care | Attending: Orthopedic Surgery

## 2022-09-03 ENCOUNTER — Ambulatory Visit (HOSPITAL_BASED_OUTPATIENT_CLINIC_OR_DEPARTMENT_OTHER): Payer: No Typology Code available for payment source | Admitting: Certified Registered Nurse Anesthetist

## 2022-09-03 ENCOUNTER — Ambulatory Visit (HOSPITAL_COMMUNITY): Payer: BC Managed Care – PPO

## 2022-09-03 ENCOUNTER — Ambulatory Visit (HOSPITAL_COMMUNITY)
Admission: RE | Admit: 2022-09-03 | Discharge: 2022-09-03 | Disposition: A | Discharge status: Home / Self Care | Payer: No Typology Code available for payment source | Attending: Orthopedic Surgery | Admitting: Orthopedic Surgery

## 2022-09-03 ENCOUNTER — Ambulatory Visit (HOSPITAL_COMMUNITY): Payer: No Typology Code available for payment source | Admitting: Physician Assistant

## 2022-09-03 ENCOUNTER — Encounter (HOSPITAL_COMMUNITY): Payer: Self-pay | Admitting: Orthopedic Surgery

## 2022-09-03 DIAGNOSIS — M1612 Unilateral primary osteoarthritis, left hip: Secondary | ICD-10-CM | POA: Diagnosis present

## 2022-09-03 DIAGNOSIS — M25752 Osteophyte, left hip: Secondary | ICD-10-CM | POA: Diagnosis not present

## 2022-09-03 DIAGNOSIS — Z01818 Encounter for other preprocedural examination: Secondary | ICD-10-CM

## 2022-09-03 HISTORY — PX: TOTAL HIP ARTHROPLASTY: SHX124

## 2022-09-03 LAB — TYPE AND SCREEN
ABO/RH(D): O POS
Antibody Screen: NEGATIVE

## 2022-09-03 LAB — POCT PREGNANCY, URINE: Preg Test, Ur: NEGATIVE

## 2022-09-03 SURGERY — ARTHROPLASTY, HIP, TOTAL, ANTERIOR APPROACH
Anesthesia: Spinal | Site: Hip | Laterality: Left

## 2022-09-03 MED ORDER — BUPIVACAINE IN DEXTROSE 0.75-8.25 % IT SOLN
INTRATHECAL | Status: DC | PRN
  Administered 2022-09-03: 2 mL via INTRATHECAL

## 2022-09-03 MED ORDER — HYDROMORPHONE HCL 1 MG/ML IJ SOLN
INTRAMUSCULAR | Status: AC
  Filled 2022-09-03: qty 1

## 2022-09-03 MED ORDER — GLYCOPYRROLATE 0.2 MG/ML IJ SOLN
INTRAMUSCULAR | Status: DC | PRN
  Administered 2022-09-03 (×2): .1 mg via INTRAVENOUS

## 2022-09-03 MED ORDER — TIZANIDINE HCL 2 MG PO TABS: 2.0000 mg | ORAL_TABLET | Freq: Four times a day (QID) | ORAL | 0 refills | Status: DC | PRN

## 2022-09-03 MED ORDER — ORAL CARE MOUTH RINSE: 15.0000 mL | Freq: Once | OROMUCOSAL | Status: AC

## 2022-09-03 MED ORDER — OXYCODONE HCL 5 MG PO TABS
5.0000 mg | ORAL_TABLET | ORAL | Status: DC | PRN
  Administered 2022-09-03: 5 mg via ORAL

## 2022-09-03 MED ORDER — PHENYLEPHRINE 80 MCG/ML (10ML) SYRINGE FOR IV PUSH (FOR BLOOD PRESSURE SUPPORT)
PREFILLED_SYRINGE | INTRAVENOUS | Status: DC | PRN
  Administered 2022-09-03 (×5): 80 ug via INTRAVENOUS

## 2022-09-03 MED ORDER — MEPERIDINE HCL 50 MG/ML IJ SOLN
INTRAMUSCULAR | Status: AC
  Administered 2022-09-03: 12.5 mg via INTRAVENOUS
  Filled 2022-09-03: qty 1

## 2022-09-03 MED ORDER — OXYCODONE HCL 5 MG PO TABS
5.0000 mg | ORAL_TABLET | Freq: Once | ORAL | Status: AC | PRN
  Administered 2022-09-03: 5 mg via ORAL

## 2022-09-03 MED ORDER — MEPERIDINE HCL 50 MG/ML IJ SOLN: 12.5000 mg | Freq: Once | INTRAMUSCULAR | Status: DC

## 2022-09-03 MED ORDER — BUPIVACAINE-EPINEPHRINE (PF) 0.5% -1:200000 IJ SOLN
INTRAMUSCULAR | Status: AC
  Filled 2022-09-03: qty 30

## 2022-09-03 MED ORDER — PROPOFOL 1000 MG/100ML IV EMUL
INTRAVENOUS | Status: AC
  Filled 2022-09-03: qty 100

## 2022-09-03 MED ORDER — DEXAMETHASONE SODIUM PHOSPHATE 10 MG/ML IJ SOLN
INTRAMUSCULAR | Status: AC
  Filled 2022-09-03: qty 1

## 2022-09-03 MED ORDER — OXYCODONE HCL 5 MG PO TABS
ORAL_TABLET | ORAL | Status: AC
  Filled 2022-09-03: qty 1

## 2022-09-03 MED ORDER — TRANEXAMIC ACID-NACL 1000-0.7 MG/100ML-% IV SOLN
1000.0000 mg | INTRAVENOUS | Status: AC
  Administered 2022-09-03: 1000 mg via INTRAVENOUS
  Filled 2022-09-03: qty 100

## 2022-09-03 MED ORDER — BUPIVACAINE-EPINEPHRINE 0.5% -1:200000 IJ SOLN
INTRAMUSCULAR | Status: DC | PRN
  Administered 2022-09-03: 30 mL

## 2022-09-03 MED ORDER — OXYCODONE HCL 5 MG PO TABS: 10.0000 mg | ORAL_TABLET | ORAL | Status: DC | PRN

## 2022-09-03 MED ORDER — CEFAZOLIN SODIUM-DEXTROSE 2-4 GM/100ML-% IV SOLN
2.0000 g | INTRAVENOUS | Status: AC
  Administered 2022-09-03: 2 g via INTRAVENOUS
  Filled 2022-09-03: qty 100

## 2022-09-03 MED ORDER — PROPOFOL 10 MG/ML IV BOLUS
INTRAVENOUS | Status: DC | PRN
  Administered 2022-09-03: 30 mg via INTRAVENOUS
  Administered 2022-09-03 (×3): 20 mg via INTRAVENOUS

## 2022-09-03 MED ORDER — SODIUM CHLORIDE 0.9% FLUSH
INTRAVENOUS | Status: DC | PRN
  Administered 2022-09-03: 50 mL

## 2022-09-03 MED ORDER — ACETAMINOPHEN 500 MG PO TABS
ORAL_TABLET | ORAL | Status: AC
  Filled 2022-09-03: qty 2

## 2022-09-03 MED ORDER — POVIDONE-IODINE 10 % EX SWAB
2.0000 | Freq: Once | CUTANEOUS | Status: AC
  Administered 2022-09-03: 2 via TOPICAL

## 2022-09-03 MED ORDER — SODIUM CHLORIDE 0.9 % IV SOLN: 12.5000 mg/h | Freq: Once | INTRAVENOUS | Status: DC

## 2022-09-03 MED ORDER — ACETAMINOPHEN 500 MG PO TABS
1000.0000 mg | ORAL_TABLET | Freq: Four times a day (QID) | ORAL | Status: DC
  Administered 2022-09-03: 1000 mg via ORAL

## 2022-09-03 MED ORDER — PHENYLEPHRINE HCL-NACL 20-0.9 MG/250ML-% IV SOLN
INTRAVENOUS | Status: DC | PRN
  Administered 2022-09-03: 40 ug/min via INTRAVENOUS

## 2022-09-03 MED ORDER — OXYCODONE-ACETAMINOPHEN 5-325 MG PO TABS: 1.0000 | ORAL_TABLET | ORAL | 0 refills | Status: DC | PRN

## 2022-09-03 MED ORDER — SODIUM CHLORIDE (PF) 0.9 % IJ SOLN
INTRAMUSCULAR | Status: AC
  Filled 2022-09-03: qty 50

## 2022-09-03 MED ORDER — DEXAMETHASONE SODIUM PHOSPHATE 10 MG/ML IJ SOLN
INTRAMUSCULAR | Status: DC | PRN
  Administered 2022-09-03: 10 mg via INTRAVENOUS

## 2022-09-03 MED ORDER — PHENYLEPHRINE HCL (PRESSORS) 10 MG/ML IV SOLN
INTRAVENOUS | Status: AC
  Filled 2022-09-03: qty 1

## 2022-09-03 MED ORDER — DEXMEDETOMIDINE HCL IN NACL 80 MCG/20ML IV SOLN
INTRAVENOUS | Status: DC | PRN
  Administered 2022-09-03: 8 ug via BUCCAL
  Administered 2022-09-03: 4 ug via BUCCAL

## 2022-09-03 MED ORDER — PROMETHAZINE HCL 25 MG/ML IJ SOLN
6.2500 mg | INTRAMUSCULAR | Status: AC | PRN
  Administered 2022-09-03 (×2): 6.25 mg via INTRAVENOUS

## 2022-09-03 MED ORDER — BUPIVACAINE LIPOSOME 1.3 % IJ SUSP
INTRAMUSCULAR | Status: AC
  Filled 2022-09-03: qty 10

## 2022-09-03 MED ORDER — BUPIVACAINE LIPOSOME 1.3 % IJ SUSP
INTRAMUSCULAR | Status: DC | PRN
  Administered 2022-09-03: 10 mL

## 2022-09-03 MED ORDER — LACTATED RINGERS IV SOLN: INTRAVENOUS | Status: DC

## 2022-09-03 MED ORDER — TRANEXAMIC ACID 1000 MG/10ML IV SOLN
2000.0000 mg | INTRAVENOUS | Status: DC
  Filled 2022-09-03: qty 20

## 2022-09-03 MED ORDER — LACTATED RINGERS IV BOLUS
500.0000 mL | Freq: Once | INTRAVENOUS | Status: AC
  Administered 2022-09-03: 500 mL via INTRAVENOUS

## 2022-09-03 MED ORDER — ASPIRIN 81 MG PO TBEC: 81.0000 mg | DELAYED_RELEASE_TABLET | Freq: Two times a day (BID) | ORAL | 0 refills | Status: DC

## 2022-09-03 MED ORDER — MIDAZOLAM HCL 5 MG/5ML IJ SOLN
INTRAMUSCULAR | Status: DC | PRN
  Administered 2022-09-03: 2 mg via INTRAVENOUS

## 2022-09-03 MED ORDER — METHOCARBAMOL 500 MG IVPB - SIMPLE MED: 500.0000 mg | Freq: Four times a day (QID) | INTRAVENOUS | Status: DC | PRN

## 2022-09-03 MED ORDER — TRANEXAMIC ACID 1000 MG/10ML IV SOLN
INTRAVENOUS | Status: DC | PRN
  Administered 2022-09-03: 2000 mg via TOPICAL

## 2022-09-03 MED ORDER — HYDROMORPHONE HCL 1 MG/ML IJ SOLN
0.2500 mg | INTRAMUSCULAR | Status: DC | PRN
  Administered 2022-09-03 (×3): 0.5 mg via INTRAVENOUS

## 2022-09-03 MED ORDER — PROPOFOL 500 MG/50ML IV EMUL
INTRAVENOUS | Status: DC | PRN
  Administered 2022-09-03: 50 ug/kg/min via INTRAVENOUS

## 2022-09-03 MED ORDER — METHOCARBAMOL 500 MG IVPB - SIMPLE MED
INTRAVENOUS | Status: AC
  Filled 2022-09-03: qty 55

## 2022-09-03 MED ORDER — CHLORHEXIDINE GLUCONATE 0.12 % MT SOLN
15.0000 mL | Freq: Once | OROMUCOSAL | Status: AC
  Administered 2022-09-03: 15 mL via OROMUCOSAL

## 2022-09-03 MED ORDER — TRANEXAMIC ACID-NACL 1000-0.7 MG/100ML-% IV SOLN
INTRAVENOUS | Status: AC
  Filled 2022-09-03: qty 100

## 2022-09-03 MED ORDER — HYDROMORPHONE HCL 1 MG/ML IJ SOLN: 0.5000 mg | INTRAMUSCULAR | Status: DC | PRN

## 2022-09-03 MED ORDER — BUPIVACAINE LIPOSOME 1.3 % IJ SUSP: 10.0000 mL | Freq: Once | INTRAMUSCULAR | Status: DC

## 2022-09-03 MED ORDER — ACETAMINOPHEN 325 MG PO TABS: 325.0000 mg | ORAL_TABLET | Freq: Four times a day (QID) | ORAL | Status: DC | PRN

## 2022-09-03 MED ORDER — LACTATED RINGERS IV BOLUS
250.0000 mL | Freq: Once | INTRAVENOUS | Status: AC
  Administered 2022-09-03: 250 mL via INTRAVENOUS

## 2022-09-03 MED ORDER — 0.9 % SODIUM CHLORIDE (POUR BTL) OPTIME
TOPICAL | Status: DC | PRN
  Administered 2022-09-03: 1000 mL

## 2022-09-03 MED ORDER — MIDAZOLAM HCL 2 MG/2ML IJ SOLN
INTRAMUSCULAR | Status: AC
  Filled 2022-09-03: qty 2

## 2022-09-03 MED ORDER — OXYCODONE HCL 5 MG/5ML PO SOLN: 5.0000 mg | Freq: Once | ORAL | Status: AC | PRN

## 2022-09-03 MED ORDER — ONDANSETRON HCL 4 MG/2ML IJ SOLN
INTRAMUSCULAR | Status: DC | PRN
  Administered 2022-09-03: 4 mg via INTRAVENOUS

## 2022-09-03 MED ORDER — ONDANSETRON HCL 4 MG/2ML IJ SOLN
INTRAMUSCULAR | Status: AC
  Filled 2022-09-03: qty 2

## 2022-09-03 MED ORDER — TRANEXAMIC ACID-NACL 1000-0.7 MG/100ML-% IV SOLN
1000.0000 mg | Freq: Once | INTRAVENOUS | Status: AC
  Administered 2022-09-03: 1000 mg via INTRAVENOUS

## 2022-09-03 MED ORDER — PROMETHAZINE HCL 25 MG/ML IJ SOLN
INTRAMUSCULAR | Status: AC
  Filled 2022-09-03: qty 1

## 2022-09-03 MED ORDER — METHOCARBAMOL 500 MG PO TABS: 500.0000 mg | ORAL_TABLET | Freq: Four times a day (QID) | ORAL | Status: DC | PRN

## 2022-09-03 SURGICAL SUPPLY — 44 items
BAG COUNTER SPONGE SURGICOUNT (BAG) ×1 IMPLANT
BAG DECANTER FOR FLEXI CONT (MISCELLANEOUS) ×2 IMPLANT
BLADE SAW SGTL 18X1.27X75 (BLADE) ×1 IMPLANT
COVER PERINEAL POST (MISCELLANEOUS) ×1 IMPLANT
COVER SURGICAL LIGHT HANDLE (MISCELLANEOUS) ×1 IMPLANT
DRAPE STERI IOBAN 125X83 (DRAPES) ×1 IMPLANT
DRAPE U-SHAPE 47X51 STRL (DRAPES) ×2 IMPLANT
DRSG AQUACEL AG ADV 3.5X10 (GAUZE/BANDAGES/DRESSINGS) ×1 IMPLANT
DURAPREP 26ML APPLICATOR (WOUND CARE) ×1 IMPLANT
ELECT BLADE TIP CTD 4 INCH (ELECTRODE) ×1 IMPLANT
ELECT REM PT RETURN 15FT ADLT (MISCELLANEOUS) ×1 IMPLANT
ELIMINATOR HOLE APEX DEPUY (Hips) IMPLANT
GLOVE BIO SURGEON STRL SZ7.5 (GLOVE) ×1 IMPLANT
GLOVE BIO SURGEON STRL SZ8.5 (GLOVE) ×1 IMPLANT
GLOVE BIOGEL PI IND STRL 8 (GLOVE) ×1 IMPLANT
GLOVE BIOGEL PI IND STRL 9 (GLOVE) ×1 IMPLANT
GOWN STRL REUS W/ TWL XL LVL3 (GOWN DISPOSABLE) ×2 IMPLANT
GOWN STRL REUS W/TWL XL LVL3 (GOWN DISPOSABLE) ×2
HEAD FEMORAL 32 CERAMIC (Hips) IMPLANT
HOLDER FOLEY CATH W/STRAP (MISCELLANEOUS) ×1 IMPLANT
KIT TURNOVER KIT A (KITS) IMPLANT
LINER ACET PNNCL PLUS4 NEUTRAL (Hips) IMPLANT
MANIFOLD NEPTUNE II (INSTRUMENTS) ×1 IMPLANT
NDL HYPO 21X1.5 SAFETY (NEEDLE) ×2 IMPLANT
NEEDLE HYPO 21X1.5 SAFETY (NEEDLE) ×2 IMPLANT
NS IRRIG 1000ML POUR BTL (IV SOLUTION) ×1 IMPLANT
PACK ANTERIOR HIP CUSTOM (KITS) ×1 IMPLANT
PIN SECT CUP 50MM (Hips) IMPLANT
PINNACLE PLUS 4 NEUTRAL (Hips) ×1 IMPLANT
SPIKE FLUID TRANSFER (MISCELLANEOUS) ×1 IMPLANT
STEM FEM ACTIS STD SZ2 (Stem) IMPLANT
SUT ETHIBOND NAB CT1 #1 30IN (SUTURE) IMPLANT
SUT VIC AB 0 CT1 27 (SUTURE)
SUT VIC AB 0 CT1 27XBRD ANBCTR (SUTURE) IMPLANT
SUT VIC AB 1 CTX 36 (SUTURE) ×1
SUT VIC AB 1 CTX36XBRD ANBCTR (SUTURE) ×1 IMPLANT
SUT VIC AB 2-0 CT1 27 (SUTURE) ×1
SUT VIC AB 2-0 CT1 TAPERPNT 27 (SUTURE) ×1 IMPLANT
SUT VIC AB 3-0 CT1 27 (SUTURE) ×1
SUT VIC AB 3-0 CT1 TAPERPNT 27 (SUTURE) ×1 IMPLANT
SYR CONTROL 10ML LL (SYRINGE) ×3 IMPLANT
TRAY CATH INTERMITTENT SS 16FR (CATHETERS) IMPLANT
TRAY FOLEY MTR SLVR 16FR STAT (SET/KITS/TRAYS/PACK) IMPLANT
TUBE SUCTION HIGH CAP CLEAR NV (SUCTIONS) IMPLANT

## 2022-09-03 NOTE — Interval H&P Note (Signed)
History and Physical Interval Note:  09/03/2022 9:04 AM  Heather Delacruz  has presented today for surgery, with the diagnosis of LEFT HIP OSTEOARTHRITIS.  The various methods of treatment have been discussed with the patient and family. After consideration of risks, benefits and other options for treatment, the patient has consented to  Procedure(s): LEFT TOTAL HIP ARTHROPLASTY ANTERIOR APPROACH (Left) as a surgical intervention.  The patient's history has been reviewed, patient examined, no change in status, stable for surgery.  I have reviewed the patient's chart and labs.  Questions were answered to the patient's satisfaction.     Kerin Salen

## 2022-09-03 NOTE — Anesthesia Preprocedure Evaluation (Signed)
Anesthesia Evaluation  Patient identified by MRN, date of birth, ID band Patient awake    Reviewed: Allergy & Precautions, H&P , NPO status , Patient's Chart, lab work & pertinent test results  History of Anesthesia Complications (+) PONV and history of anesthetic complications  Airway Mallampati: II  TM Distance: >3 FB Neck ROM: Full    Dental no notable dental hx.    Pulmonary neg pulmonary ROS   Pulmonary exam normal breath sounds clear to auscultation       Cardiovascular negative cardio ROS Normal cardiovascular exam Rhythm:Regular Rate:Normal     Neuro/Psych negative neurological ROS  negative psych ROS   GI/Hepatic negative GI ROS, Neg liver ROS,,,  Endo/Other  negative endocrine ROS    Renal/GU negative Renal ROS  negative genitourinary   Musculoskeletal  (+) Arthritis , Osteoarthritis,    Abdominal   Peds negative pediatric ROS (+)  Hematology negative hematology ROS (+)   Anesthesia Other Findings   Reproductive/Obstetrics negative OB ROS                             Anesthesia Physical Anesthesia Plan  ASA: 2  Anesthesia Plan: Spinal   Post-op Pain Management: Ofirmev IV (intra-op)*   Induction: Intravenous  PONV Risk Score and Plan: 3 and Ondansetron, Dexamethasone, Midazolam and Treatment may vary due to age or medical condition  Airway Management Planned: Simple Face Mask  Additional Equipment:   Intra-op Plan:   Post-operative Plan:   Informed Consent: I have reviewed the patients History and Physical, chart, labs and discussed the procedure including the risks, benefits and alternatives for the proposed anesthesia with the patient or authorized representative who has indicated his/her understanding and acceptance.     Dental advisory given  Plan Discussed with: CRNA  Anesthesia Plan Comments:        Anesthesia Quick Evaluation

## 2022-09-03 NOTE — Anesthesia Postprocedure Evaluation (Signed)
Anesthesia Post Note  Patient: Heather Delacruz  Procedure(s) Performed: LEFT TOTAL HIP ARTHROPLASTY ANTERIOR APPROACH (Left: Hip)     Patient location during evaluation: PACU Anesthesia Type: Spinal Level of consciousness: awake and alert Pain management: pain level controlled Vital Signs Assessment: post-procedure vital signs reviewed and stable Respiratory status: spontaneous breathing, nonlabored ventilation and respiratory function stable Cardiovascular status: blood pressure returned to baseline and stable Postop Assessment: no apparent nausea or vomiting Anesthetic complications: no   No notable events documented.  Last Vitals:  Vitals:   09/03/22 0805 09/03/22 1230  BP: (!) 138/101 111/78  Pulse:  75  Resp:  17  Temp:  36.7 C  SpO2:  100%    Last Pain:  Vitals:   09/03/22 0815  TempSrc:   PainSc: 0-No pain                 Lynda Rainwater

## 2022-09-03 NOTE — Op Note (Signed)
PATIENT ID:      Heather Delacruz  MRN:     [id number] DOB/AGE:    [birth year] / 41 y.o.  OPERATIVE REPORT   DATE OF PROCEDURE:  09/03/2022      PREOPERATIVE DIAGNOSIS:  LEFT HIP OSTEOARTHRITIS                                                         POSTOPERATIVE DIAGNOSIS:  Same                                                         PROCEDURE: Anterior L total hip arthroplasty using a 50 mm DePuy Pinnacle  Cup, [employer], 0-degree polyethylene liner, a +1 mm x 27m ceramic head, a 2 std Depuy Actis stem  SURGEON: FKerin Salen ASSISTANT:   EKerry Hough [employer] (present throughout entire procedure and necessary for timely completion of the procedure)   ANESTHESIA: Spinal, Exparel '133mg'$  injection BLOOD LOSS: 400 cc FLUID REPLACEMENT: 1500 cc crystalloid TRANEXAMIC ACID: 1gm IV, 2gm Topical COMPLICATIONS: none    INDICATIONS FOR PROCEDURE: A 41y.o. year-old With  LEFT HIP OSTEOARTHRITIS   for 3 years, x-rays show bone-on-bone arthritic changes, and osteophytes. Despite conservative measures with observation, anti-inflammatory medicine, narcotics, use of a cane, has severe unremitting pain and can ambulate only a few blocks before resting. Patient desires elective L total hip arthroplasty to decrease pain and increase function. The risks, benefits, and alternatives were discussed at length including but not limited to the risks of infection, bleeding, nerve injury, stiffness, blood clots, the need for revision surgery, cardiopulmonary complications, among others, and they were willing to proceed. Questions answered      PROCEDURE IN DETAIL: The patient was identified by armband,   [facility] room , appropriate anesthetic monitors   were attached and anesthesia was induced with the patient on the gurney. HANA boots were applied to the feet, and the patient  was transferred to the  HANA table with a peroneal post and support underneath the non-operative leg. Theoperative lower extremity was then prepped and draped in the usual sterile fashion from just above the iliac crest to the knee. And a timeout procedure was performed. EKerry Hough PHardin NegusPAtmore Community Hospitalwas present and scrubbed throughout the case, critical for assistance with, positioning, exposure, retraction, instrumentation, and closure.Skin along incision area was injected with 10 cc of Exparel solution. We then made a 12 cm incision along the interval at the leading edge of the tensor fascia lata of starting at 2 cm lateral to the ASIS. Small bleeders in the skin and subcutaneous tissue identified and cauterized we dissected down to the fascia and made an incision in the fascia allowing uKoreato elevate the fascia of the tensor muscle and exploited the interval between the rectus and the tensor fascia lata. A Cobra retractor was then placed along the superior neck of the femur. A cerebellar retractor was used to expose the interval between the tensor fascia lata and the rectus femoris.  We identified  and cauterized the ascending branch of the anterior circumflex artery. A second Cobra retractor along the inferior neck of the femur. A small Hohmann retractor was placed underneath the origin of the rectus femoris, giving [place] good medial exposure. Using Ronguers fatty tissue was removed from in front of the anterior capsule. The capsule was then incised, starting out at the superior anterior rim of the acetabulum going laterally along the anterior neck. The capsule was then teed along the neck superiorly and inferiorly. Electrocautery was used to release capsule from the anterior and medial neck of the femur to allow external rotation. Cobra retractors were then placed along the inferior and superior neck allowing [place] to perform a standard neck cut and removed the femoral head with a power corkscrew. We then placed a medium bent homan retractor in the  cotyloid notch and posteriorly along the acetabular rim a narrow Cobra retractor. Exposed labral tissue and osteophytes were then removed. We then sequentially reamed up to a 50 mm basket reamer obtaining good coverage in all quadrants, verified by C-arm imaging. Under C-arm control we then hammered into place a 50 mm Pinnacle cup in 45 of abduction and 15 of anteversion. The cup seated nicely and required no supplemental screws. We then placed a central hole Eliminator and a 0 polyethylene liner. The foot was then externally rotated to 130-140. The limb was extended and adducted to the floor, delivering the proximal femur up into the wound. A medium curved Hohmann retractor was placed over the greater trochanter and a long Homan retractor along the posterior femoral neck completing the exposure and lateralizing the femur. We then performed releases superiorly and and inferiorly of the capsule going back to the pirformis fossa superiorly and to the lesser trochanter inferiorly. We then entered the proximal femur with the box cutting offset chisel followed by, a canal sounder, the chili pepper and broaching up to a 2 broach. This seated nicely and we reamed the calcar. A trial reduction was performed with a 1 mm X 32 mm head.The limb lengths were excellent the hip was stable in 90 of external rotation. At this point the trial components removed and we hammered into place a # 2 std  Offset Actis stem with Gryption coating. A + 1 mm x 32 head was then hammered into place. The hip was reduced and final C-arm images obtained. The wound was thoroughly irrigated with normal saline solution. We repaired the ant capsule and the tensor fascia lot a with running 0 vicryl suture. the subcutaneous tissue was closed with 2-0 and 3-0 Vicryl suture followed by an Aquacil dressing. At this point the patient was awaken and transferred to hospital gurney without difficulty.   Kerin Salen 09/03/2022, 9:05 AM

## 2022-09-03 NOTE — Transfer of Care (Signed)
Immediate Anesthesia Transfer of Care Note  Patient: Heather Delacruz  Procedure(s) Performed: Procedure(s): LEFT TOTAL HIP ARTHROPLASTY ANTERIOR APPROACH (Left)  Patient Location: PACU  Anesthesia Type:MAC, General, and Spinal  Level of Consciousness: Patient easily awoken, sedated, comfortable, cooperative, following commands, responds to stimulation.   Airway & Oxygen Therapy: Patient spontaneously breathing, ventilating well, oxygen via simple oxygen mask.  Post-op Assessment: Report given to PACU RN, vital signs reviewed and stable.   Post vital signs: Reviewed and stable.  Complications: No apparent anesthesia complications  Last Vitals:  Vitals Value Taken Time  BP 130/89 09/03/22 1224  Temp    Pulse 80 09/03/22 1226  Resp 20 09/03/22 1226  SpO2 100 % 09/03/22 1226  Vitals shown include unvalidated device data.  Last Pain:  Vitals:   09/03/22 0815  TempSrc:   PainSc: 0-No pain         Complications: No notable events documented.

## 2022-09-03 NOTE — Anesthesia Procedure Notes (Signed)
Spinal  Patient location during procedure: OR Start time: 09/03/2022 10:22 AM End time: 09/03/2022 10:27 AM Reason for block: surgical anesthesia Staffing Performed: anesthesiologist  Anesthesiologist: Lynda Rainwater, MD Performed by: Lynda Rainwater, MD Authorized by: Lynda Rainwater, MD   Preanesthetic Checklist Completed: patient identified, IV checked, site marked, risks and benefits discussed, surgical consent, monitors and equipment checked, pre-op evaluation and timeout performed Spinal Block Patient position: sitting Prep: DuraPrep Patient monitoring: heart rate, cardiac monitor, continuous pulse ox and blood pressure Approach: midline Location: L3-4 Injection technique: single-shot Needle Needle type: Sprotte  Needle gauge: 24 G Needle length: 9 cm Assessment Sensory level: T4 Events: CSF return

## 2022-09-03 NOTE — Discharge Instructions (Addendum)

## 2022-09-03 NOTE — Anesthesia Procedure Notes (Signed)
Procedure Name: MAC Date/Time: 09/03/2022 10:22 AM  Performed by: Deliah Boston, CRNAPre-anesthesia Checklist: Patient identified, Emergency Drugs available, Suction available and Patient being monitored Patient Re-evaluated:Patient Re-evaluated prior to induction Oxygen Delivery Method: Simple face mask Placement Confirmation: positive ETCO2 and breath sounds checked- equal and bilateral

## 2022-09-03 NOTE — Evaluation (Signed)
Physical Therapy Evaluation Patient Details Name: Heather Delacruz MRN: [id number] DOB: 1981/08/10 Today's Date: 09/03/2022  History of Present Illness  Pt is a 41yo female presenting s/p L-THA, AA on 09/03/22. PMH: hx of breast cancer s/p lumpectomy and radiation, PNA.  Clinical Impression  Heather Delacruz is a 41 y.o. female POD 0 s/p L-THAAA. Patient reports independence with mobility at baseline. Patient is now limited by functional impairments (see PT problem list below) and requires min guard for transfers and gait with RW. Patient was able to ambulate 80 feet with RW and min guard and cues for safe walker management. Patient educated on safe sequencing for stair mobility and verbalized safe guarding position for people assisting with mobility. Patient instructed in exercises to facilitate ROM and circulation. Patient will benefit from continued skilled PT interventions to address impairments and progress towards PLOF. Patient has met mobility goals at adequate level for discharge home; will continue to follow if pt continues acute stay to progress towards Mod I goals.       Recommendations for follow up therapy are one component of a multi-disciplinary discharge planning process, led by the attending physician.  Recommendations may be updated based on patient status, additional functional criteria and insurance authorization.  Follow Up Recommendations Follow physician's recommendations for discharge plan and follow up therapies      Assistance Recommended at Discharge Frequent or constant Supervision/Assistance  Patient can return home with the following  A little help with walking and/or transfers;A little help with bathing/dressing/bathroom;Assistance with cooking/housework;Assist for transportation;Help with stairs or ramp for entrance    Equipment Recommendations None recommended by PT  Recommendations for Other Services       Functional Status Assessment Patient has  had a recent decline in their functional status and demonstrates the ability to make significant improvements in function in a reasonable and predictable amount of time.     Precautions / Restrictions Precautions Precautions: Fall Restrictions Weight Bearing Restrictions: No Other Position/Activity Restrictions: wbat      Mobility  Bed Mobility Overal bed mobility: Needs Assistance Bed Mobility: Supine to Sit     Supine to sit: Min guard, HOB elevated     General bed mobility comments: For safety only    Transfers Overall transfer level: Needs assistance Equipment used: Rolling walker (2 wheels) Transfers: Sit to/from Stand Sit to Stand: Min guard, From elevated surface           General transfer comment: min guard for safety only. Pt had one minor LOB, suspect secondary to continued decreased motor control from spinal anesthesia.    Ambulation/Gait Ambulation/Gait assistance: Min guard Gait Distance (Feet): 80 Feet Assistive device: Rolling walker (2 wheels) Gait Pattern/deviations: WFL(Within Functional Limits) Gait velocity: decreased     General Gait Details: Pt ambulated with RW and min guard with one minor lateral LOB that the pt was able to self-correct. Pt utilizing knee hyperextension to maintain stability, with VCs able to widen stance.  Stairs Stairs: Yes Stairs assistance: Min guard Stair Management: Two rails, Step to pattern, Forwards Number of Stairs: 2 General stair comments: Pt completed stair training with min guard and VCs for sequencing, no overt LOB noted or physical assist required.  Wheelchair Mobility    Modified Rankin (Stroke Patients Only)       Balance Overall balance assessment: Needs assistance Sitting-balance support: Feet supported, No upper extremity supported Sitting balance-Leahy Scale: Good     Standing balance support: Reliant on assistive device for balance, During  functional activity, Bilateral upper extremity  supported Standing balance-Leahy Scale: Poor                               Pertinent Vitals/Pain Pain Assessment Pain Assessment: 0-10 Pain Score: 5  Pain Location: left hip Pain Descriptors / Indicators: Operative site guarding, Burning, Sharp Pain Intervention(s): Limited activity within patient's tolerance, Monitored during session, Repositioned, Ice applied    Home Living Family/patient expects to be discharged to:: Private residence Living Arrangements: Spouse/significant other;Children Available Help at Discharge: Family;Available 24 hours/day Type of Home: House Home Access: Stairs to enter Entrance Stairs-Rails: Right;Left;Can reach both Entrance Stairs-Number of Steps: 4 Alternate Level Stairs-Number of Steps: 14 Home Layout: Two level;Bed/bath upstairs Home Equipment: Advice worker (2 wheels);Cane - single point      Prior Function Prior Level of Function : Independent/Modified Independent;Driving;Working/employed (Working as a PT)             Mobility Comments: IND ADLs Comments: IND     Hand Dominance   Dominant Hand: Right    Extremity/Trunk Assessment   Upper Extremity Assessment Upper Extremity Assessment: Overall WFL for tasks assessed    Lower Extremity Assessment Lower Extremity Assessment: RLE deficits/detail;LLE deficits/detail RLE Deficits / Details: MMT ank DF/PF 5/5 RLE Sensation: WNL LLE Deficits / Details: MMT ank DF/PF 5/5 LLE Sensation: WNL    Cervical / Trunk Assessment Cervical / Trunk Assessment: Normal  Communication   Communication: No difficulties  Cognition Arousal/Alertness: Awake/alert Behavior During Therapy: WFL for tasks assessed/performed Overall Cognitive Status: Within Functional Limits for tasks assessed                                          General Comments      Exercises Total Joint Exercises Ankle Circles/Pumps: AROM, Both, 10 reps Quad Sets: AROM, Left,  Other reps (comment) (2) Short Arc Quad: AROM, Left, Other reps (comment) (2) Heel Slides: AROM, Left, Other reps (comment) (2) Hip ABduction/ADduction: AROM, Left, Other reps (comment) (2) Long Arc Quad: AROM, Both, 5 reps   Assessment/Plan    PT Assessment Patient needs continued PT services  PT Problem List Decreased strength;Decreased range of motion;Decreased activity tolerance;Decreased balance;Decreased mobility;Decreased coordination;Pain       PT Treatment Interventions DME instruction;Gait training;Stair training;Functional mobility training;Therapeutic activities;Therapeutic exercise;Balance training;Neuromuscular re-education;Patient/family education    PT Goals (Current goals can be found in the Care Plan section)  Acute Rehab PT Goals Patient Stated Goal: Independent with ambulation without an AD PT Goal Formulation: With patient Time For Goal Achievement: 09/10/22 Potential to Achieve Goals: Good    Frequency 7X/week     Co-evaluation               AM-PAC PT "6 Clicks" Mobility  Outcome Measure Help needed turning from your back to your side while in a flat bed without using bedrails?: None Help needed moving from lying on your back to sitting on the side of a flat bed without using bedrails?: A Little Help needed moving to and from a bed to a chair (including a wheelchair)?: A Little Help needed standing up from a chair using your arms (e.g., wheelchair or bedside chair)?: A Little Help needed to walk in hospital room?: A Little Help needed climbing 3-5 steps with a railing? : A Little 6 Click Score: 19  End of Session Equipment Utilized During Treatment: Gait belt Activity Tolerance: Patient tolerated treatment well;No increased pain Patient left: in chair;with call bell/phone within reach Nurse Communication: Mobility status PT Visit Diagnosis: Pain;Other symptoms and signs involving the nervous system (R29.898) Pain - Right/Left: Left Pain -  part of body: Hip    Time: 1440-1515 PT Time Calculation (min) (ACUTE ONLY): 35 min   Charges:   PT Evaluation $PT Eval Low Complexity: 1 Low PT Treatments $Gait Training: 8-22 mins        Coolidge Breeze, PT, DPT [facility] Department Office: [phone] Weekend pager: [phone]  Coolidge Breeze 09/03/2022, 3:31 PM

## 2022-09-04 ENCOUNTER — Encounter (HOSPITAL_COMMUNITY): Payer: Self-pay | Admitting: Orthopedic Surgery

## 2022-09-14 ENCOUNTER — Ambulatory Visit (HOSPITAL_COMMUNITY)
Admission: RE | Admit: 2022-09-14 | Discharge: 2022-09-14 | Disposition: A | Discharge status: Home / Self Care | Payer: BC Managed Care – PPO | Source: Ambulatory Visit | Attending: Hematology and Oncology | Admitting: Hematology and Oncology

## 2022-09-14 DIAGNOSIS — R911 Solitary pulmonary nodule: Secondary | ICD-10-CM | POA: Diagnosis present

## 2022-09-14 MED ORDER — SODIUM CHLORIDE (PF) 0.9 % IJ SOLN
INTRAMUSCULAR | Status: AC
  Filled 2022-09-14: qty 50

## 2022-09-14 MED ORDER — IOHEXOL 300 MG/ML  SOLN
75.0000 mL | Freq: Once | INTRAMUSCULAR | Status: AC | PRN
  Administered 2022-09-14: 75 mL via INTRAVENOUS

## 2022-09-14 NOTE — Progress Notes (Signed)
HEMATOLOGY-ONCOLOGY TELEPHONE VISIT PROGRESS NOTE  I connected with our patient on 09/18/22 at 10:45 AM EST by telephone and verified that I am speaking with the correct person using two identifiers.  I discussed the limitations, risks, security and privacy concerns of performing an evaluation and management service by telephone and the availability of in person appointments.  I also discussed with the patient that there may be a patient responsible charge related to this service. The patient expressed understanding and agreed to proceed.   History of Present Illness: Heather Delacruz is a 41 y.o with a history of right breast DCIS. Currently on tamoxifen therapy. She presents to the clinic for via phone follow-up.  She had a left hip replacement surgery and is getting physical therapy and starting to improve.  She plans to do a right hip replacement surgery in the next month or 2.  She is connected by telephone to discuss results of the CT scan.   Oncology History  Ductal carcinoma in situ (DCIS) of right breast  06/19/2019 Initial Diagnosis   Baseline routine screening mammogram showed two groups of indeterminate calcifications in the UOQ of the right breast, 1.2cm and 0.8cm. 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   07/01/2019 Genetic Testing   Negative genetic testing. No pathogenic variants identified on the [facility] Breast cancer STAT Panel + Common Hereditary Cancers Panel. The STAT Breast cancer panel offered by [facility] includes sequencing and rearrangement analysis for the following 9 genes:  ATM, BRCA1, BRCA2, CDH1, CHEK2, PALB2, PTEN, STK11 and TP53.  The Common Hereditary Cancers Panel offered by [facility] includes sequencing and/or deletion duplication testing of the following 47 genes: APC, ATM, AXIN2, BARD1, BMPR1A, BRCA1, BRCA2, BRIP1, CDH1, CDKN2A  (p14ARF), CDKN2A (p16INK4a), CKD4, CHEK2, CTNNA1, DICER1, EPCAM (Deletion/duplication testing only), GREM1 (promoter region deletion/duplication testing only), KIT, MEN1, MLH1, MSH2, MSH3, MSH6, MUTYH, NBN, NF1, NHTL1, PALB2, PDGFRA, PMS2, POLD1, POLE, PTEN, RAD50, RAD51C, RAD51D, SDHB, SDHC, SDHD, SMAD4, SMARCA4. STK11, TP53, TSC1, TSC2, and VHL.  The following genes were evaluated for sequence changes only: SDHA and HOXB13 c.251G>A variant only. The report date is 06/30/2019.   07/22/2019 Surgery   Right lumpectomy Marlou Starks) [phone]): 5.2 cm high grade DCIS, clear margins.  Complex sclerosing lesion ER 90%, PR 100%. No regional lymph nodes were examined.   07/22/2019 Cancer Staging   Staging form: Breast, AJCC 8th Edition - Pathologic stage from 07/22/2019: Stage 0 (pTis (DCIS), pN0, cM0, ER+, PR+)   08/27/2019 - 10/12/2019 Radiation Therapy   The patient initially received a dose of 50.4 Gy in 28 fractions to the breast using whole-breast tangent fields. This was delivered using a 3-D conformal technique. The pt received a boost delivering an additional 10 Gy in 5 fractions using a electron boost with 28mV electrons. The total dose was 60.4 Gy.    09/2019 - 09/2024 Anti-estrogen oral therapy   Tamoxifen     REVIEW OF SYSTEMS:   Constitutional: Denies fevers, chills or abnormal weight loss All other systems were reviewed with the patient and are negative. Observations/Objective:     Assessment Plan:  Ductal carcinoma in situ (DCIS) of right breast 06/19/2019:Baseline routine screening mammogram showed two groups of indeterminate calcifications in the UOQ of the right breast, 1.2cm and 0.8cm. Biopsy showed DCIS and LCIS arising in a complex sclerosing lesion, high grade, ER+ 90%, PR+ 100%  at posterior depth, and high grade DCIS, ER+ 100%, PR+ 70%, at middle depth.   07/22/2019: Right lumpectomy Marlou Starks): high grade DCIS, clear margins.  ER PR positive   Treatment plan: 1.  Adjuvant  radiation therapy 08/27/2019-10/12/2019 2.  Adjuvant tamoxifen for 5 years   Tamoxifen toxicities: Very occasional hot flashes     Uneven breast sizes: I sent a referral to Dr. Iran Planas out to evaluate her for plastic surgery options.   Breast cancer surveillance: 1.  Breast exam 07/19/2022: Benign 2. 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 Left Hip replacement: 09/05/22 Planning right hip replacement next year  CT chest 09/15/2022: No acute findings specifically no medial right upper lobe obvious dental abnormality noted on the chest x-ray.   Return to clinic in 1 year for follow-up    I discussed the assessment and treatment plan with the patient. The patient was provided an opportunity to ask questions and all were answered. The patient agreed with the plan and demonstrated an understanding of the instructions. The patient was advised to call back or seek an in-person evaluation if the symptoms worsen or if the condition fails to improve as anticipated.   I provided 12 minutes of non-face-to-face time during this encounter.  This includes time for charting and coordination of care   Harriette Ohara, MD

## 2022-09-18 ENCOUNTER — Inpatient Hospital Stay: Payer: BC Managed Care – PPO | Attending: Hematology and Oncology | Admitting: Hematology and Oncology

## 2022-09-18 DIAGNOSIS — D0511 Intraductal carcinoma in situ of right breast: Secondary | ICD-10-CM

## 2022-09-18 NOTE — Assessment & Plan Note (Signed)
06/19/2019:Baseline routine screening mammogram showed two groups of indeterminate calcifications in the UOQ of the right breast, 1.2cm and 0.8cm. 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   07/22/2019: Right lumpectomy Heather Delacruz): high grade DCIS, clear margins.  ER PR positive   Treatment plan: 1.  Adjuvant radiation therapy 08/27/2019-10/12/2019 2.  Adjuvant tamoxifen for 5 years   Tamoxifen toxicities: Very occasional hot flashes     Uneven breast sizes: I sent a referral to Dr. Iran Planas out to evaluate her for plastic surgery options.   Breast cancer surveillance: 1.  Breast exam 07/19/2022: Benign 2. 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  CT chest 09/15/2022: No acute findings specifically no medial right upper lobe obvious dental abnormality noted on the chest x-ray.   Return to clinic in 1 year for follow-up

## 2022-11-19 ENCOUNTER — Other Ambulatory Visit: Payer: Self-pay | Admitting: Orthopedic Surgery

## 2023-01-03 NOTE — Progress Notes (Addendum)
COVID Vaccine received:  []  No [x]  Yes Date of any COVID positive Test in last 90 days:  None  PCP - Laurin Coder  NP at  [facility] [phone] Cardiologist - none Oncology- Nicholas Lose, MD  Chest x-ray - 08-28-2022  Epic CT chest w/ contrast-09-15-2022  Epic EKG -  08-28-2022  Epic Stress Test -  ECHO -  Cardiac Cath -   PCR screen: [x]  Ordered & Completed                      []   No Order but Needs PROFEND                      []   N/A for this surgery  Surgery Plan:  [x]  Ambulatory                            []  Outpatient in bed                            []  Admit  Anesthesia:    []  General  [x]  Spinal                           []   Choice []   MAC  Pacemaker / ICD device [x]  No []  Yes        Device order form faxed [x]  No    []   Yes      Faxed to:  Spinal Cord Stimulator:[x]  No []  Yes      (Remind patient to bring remote DOS) Other Implants:   History of Sleep Apnea? [x]  No []  Yes   CPAP used?- [x]  No []  Yes    Does the patient monitor blood sugar? []  No []  Yes  [x]  N/A  GLP1 agonist- Wegovy (For weight loss only, no DM)  already on Hold for surgery  Blood Thinner / Instructions: none Aspirin Instructions: none  ERAS Protocol Ordered: []  No  [x]  Yes PRE-SURGERY [x]  ENSURE  []  G2  Patient is to be NPO after: 04:15 am  Comments:Patient is a [place].   Activity level: Can go up a flight of stairs and perform activities of daily living without stopping and without symptoms of chest pain or shortness of breath. Able to exercise without symptoms   Anesthesia review: PONV, Hx of vocal cord polyp removal.   Patient denies shortness of breath, fever, cough and chest pain at PAT appointment.  Patient verbalized understanding and agreement to the Pre-Surgical Instructions that were given to them at this PAT appointment. Patient was also educated of the need to review these PAT instructions again prior to his/her  surgery.I reviewed the appropriate phone numbers to call if they have any and questions or concerns.

## 2023-01-03 NOTE — Patient Instructions (Addendum)
SURGICAL WAITING ROOM VISITATION Patients having surgery or a procedure may have no more than 2 support people in the waiting area - these visitors may rotate in the visitor waiting room.   Due to an increase in RSV and influenza rates and associated hospitalizations, children ages 8 and under may not visit patients in [place]. If the patient needs to stay at the hospital during part of their recovery, the visitor guidelines for inpatient rooms apply.  PRE-OP VISITATION  Pre-op nurse will coordinate an appropriate time for 1 support person to accompany the patient in pre-op.  This support person may not rotate.  This visitor will be contacted when the time is appropriate for the visitor to come back in the pre-op area.  Please refer to the [facility] website for the visitor guidelines for Inpatients (after your surgery is over and you are in a regular room).  You are not required to quarantine at this time prior to your surgery. However, you must do this: Hand Hygiene often Do NOT share personal items Notify your provider if you are in close contact with someone who has COVID or you develop fever 100.4 or greater, new onset of sneezing, cough, sore throat, shortness of breath or body aches.  If you test positive for Covid or have been in contact with anyone that has tested positive in the last 10 days please notify you surgeon.    Your procedure is scheduled on:  Monday January 14, 2023  Report to [facility] Main Entrance: Walker entrance where the [employer] is available.   Report to admitting at: 05:15    AM  +++++Call this number if you have any questions or problems the morning of surgery [phone]  Do not eat food after Midnight the night prior to your surgery/procedure.  After Midnight you may have the following liquids until  04:15  AM DAY OF SURGERY  Clear Liquid Diet Water Black Coffee (sugar ok, NO MILK/CREAM OR CREAMERS)  Tea (sugar ok, NO  MILK/CREAM OR CREAMERS) regular and decaf                             Plain Jell-O  with no fruit (NO RED)                                           Fruit ices (not with fruit pulp, NO RED)                                     Popsicles (NO RED)                                                                  Juice: apple, WHITE grape, WHITE cranberry Sports drinks like Gatorade or Powerade (NO RED)                   The day of surgery:  Drink ONE (1) Pre-Surgery Clear Ensure at   04:15     AM the morning of surgery.  Drink in one sitting. Do not sip.  This drink was given to you during your hospital pre-op appointment visit. Nothing else to drink after completing the Pre-Surgery Clear Ensure : No candy, chewing gum or throat lozenges.    FOLLOW  ANY ADDITIONAL PRE OP INSTRUCTIONS YOU RECEIVED FROM YOUR SURGEON'S OFFICE!!!   Oral Hygiene is also important to reduce your risk of infection.        Remember - BRUSH YOUR TEETH THE MORNING OF SURGERY WITH YOUR REGULAR TOOTHPASTE  Do NOT smoke after Midnight the night before surgery.  Take ONLY these medicines the morning of surgery with A SIP OF WATER: Tamoxifen                     You may not have any metal on your body including hair pins, jewelry, and body piercing  Do not wear make-up, lotions, powders, perfumes or deodorant  Do not wear nail polish including gel and S&S, artificial / acrylic nails, or any other type of covering on natural nails including finger and toenails. If you have artificial nails, gel coating, etc., that needs to be removed by a nail salon, Please have this removed prior to surgery. Not doing so may mean that your surgery could be cancelled or delayed if the Surgeon or anesthesia staff feels like they are unable to monitor you safely.   Do not shave 48 hours prior to surgery to avoid nicks in your skin which may contribute to postoperative infections.   Contacts, Hearing Aids, dentures or bridgework may not be  worn into surgery. DENTURES WILL BE REMOVED PRIOR TO SURGERY PLEASE DO NOT APPLY "Poly grip" OR ADHESIVES!!!  Patients discharged on the day of surgery will not be allowed to drive home.  Someone NEEDS to stay with you for the first 24 hours after anesthesia.  Do not bring your home medications to the hospital. The Pharmacy will dispense medications listed on your medication list to you during your admission in the Hospital.  Special Instructions: Bring a copy of your healthcare power of attorney and living will documents the day of surgery, if you wish to have them scanned into your [place] [facility] Records- EPIC  Please read over the following fact sheets you were given: IF YOU HAVE QUESTIONS ABOUT YOUR [place], [place] [phone].   [place] - Preparing for Surgery Before surgery, you can play an important role.  Because skin is not sterile, your skin needs to be as free of germs as possible.  You can reduce the number of germs on your skin by washing with CHG (chlorahexidine gluconate) soap before surgery.  CHG is an antiseptic cleaner which kills germs and bonds with the skin to continue killing germs even after washing. Please DO NOT use if you have an allergy to CHG or antibacterial soaps.  If your skin becomes reddened/irritated stop using the CHG and inform your nurse when you arrive at Short Stay. Do not shave (including legs and underarms) for at least 48 hours prior to the first CHG shower.  You may shave your face/neck.  Please follow these instructions carefully:  1.  Shower with CHG Soap the night before surgery and the  morning of surgery.  2.  If you choose to wash your hair, wash your hair first as usual with your normal  shampoo.  3.  After you shampoo, rinse your hair and body thoroughly to remove the shampoo.  4.  Use CHG as you would any other liquid soap.  You can apply chg directly to the skin and wash.  Gently with a  scrungie or clean washcloth.  5.  Apply the CHG Soap to your body ONLY FROM THE NECK DOWN.   Do not use on face/ open                           Wound or open sores. Avoid contact with eyes, ears mouth and genitals (private parts).                       Wash face,  Genitals (private parts) with your normal soap.             6.  Wash thoroughly, paying special attention to the area where your  surgery  will be performed.  7.  Thoroughly rinse your body with warm water from the neck down.  8.  DO NOT shower/wash with your normal soap after using and rinsing off the CHG Soap.            9.  Pat yourself dry with a clean towel.            10.  Wear clean pajamas.            11.  Place clean sheets on your bed the night of your first shower and do not  sleep with pets.  ON THE DAY OF SURGERY : Do not apply any lotions/deodorants the morning of surgery.  Please wear clean clothes to the hospital/surgery center.    FAILURE TO FOLLOW THESE INSTRUCTIONS MAY RESULT IN THE CANCELLATION OF YOUR SURGERY  PATIENT SIGNATURE_________________________________  NURSE SIGNATURE__________________________________  ________________________________________________________________________           An incentive spirometer is a tool that can help keep your lungs clear and active. This tool measures how well you are filling your lungs with each breath. Taking long deep breaths may help reverse or decrease the chance of developing breathing (pulmonary) problems (especially infection) following: A long period of time when you are unable to move or be active. BEFORE THE PROCEDURE  If the spirometer includes an indicator to show your best effort, your nurse or respiratory therapist will set it to a desired goal. If possible, sit up straight or lean slightly forward. Try not to slouch. Hold the incentive spirometer in an upright position. INSTRUCTIONS FOR USE  Sit on the edge of your bed if possible, or sit up  as far as you can in bed or on a chair. Hold the incentive spirometer in an upright position. Breathe out normally. Place the mouthpiece in your mouth and seal your lips tightly around it. Breathe in slowly and as deeply as possible, raising the piston or the ball toward the top of the column. Hold your breath for 3-5 seconds or for as long as possible. Allow the piston or ball to fall to the bottom of the column. Remove the mouthpiece from your mouth and breathe out normally. Rest for a few seconds and repeat Steps 1 through 7 at least 10 times every 1-2 hours when you are awake. Take your time and take a few normal breaths between deep breaths. The spirometer may include an indicator to show your best effort. Use the indicator as a goal to work toward during each repetition. After each set of 10 deep breaths, practice coughing to be sure  your lungs are clear. If you have an incision (the cut made at the time of surgery), support your incision when coughing by placing a pillow or rolled up towels firmly against it. Once you are able to get out of bed, walk around indoors and cough well. You may stop using the incentive spirometer when instructed by your caregiver.  RISKS AND COMPLICATIONS Take your time so you do not get dizzy or light-headed. If you are in pain, you may need to take or ask for pain medication before doing incentive spirometry. It is harder to take a deep breath if you are having pain. AFTER USE Rest and breathe slowly and easily. It can be helpful to keep track of a log of your progress. Your caregiver can provide you with a simple table to help with this. If you are using the spirometer at home, follow these instructions: [place] IF:  You are having difficultly using the spirometer. You have trouble using the spirometer as often as instructed. Your pain medication is not giving enough relief while using the spirometer. You develop fever of 100.5 F (38.1 C) or  higher.                                                                                                    SEEK IMMEDIATE MEDICAL CARE IF:  You cough up bloody sputum that had not been present before. You develop fever of 102 F (38.9 C) or greater. You develop worsening pain at or near the incision site. MAKE SURE YOU:  Understand these instructions. Will watch your condition. Will get help right away if you are not doing well or get worse. Document Released: 02/18/2007 Document Revised: 12/31/2011 Document Reviewed: 04/21/2007 [facility] Patient Information 2014 [place], [place].     WHAT IS A BLOOD TRANSFUSION? Blood Transfusion Information  A transfusion is the replacement of blood or some of its parts. Blood is made up of multiple cells which provide different functions. Red blood cells carry oxygen and are used for blood loss replacement. White blood cells fight against infection. Platelets control bleeding. Plasma helps clot blood. Other blood products are available for specialized needs, such as hemophilia or other clotting disorders. BEFORE THE TRANSFUSION  Who gives blood for transfusions?  Healthy volunteers who are fully evaluated to make sure their blood is safe. This is blood bank blood. Transfusion therapy is the safest it has ever been in the practice of medicine. Before blood is taken from a donor, a complete history is taken to make sure that person has no history of diseases nor engages in risky social behavior (examples are intravenous drug use or sexual activity with multiple partners). The donor's travel history is screened to minimize risk of transmitting infections, such as malaria. The donated blood is tested for signs of infectious diseases, such as HIV and hepatitis. The blood is then tested to be sure it is compatible with you in order to minimize the chance of a transfusion reaction. If you or a relative donates blood, this is often done in anticipation of surgery  and  is not appropriate for emergency situations. It takes many days to process the donated blood. RISKS AND COMPLICATIONS Although transfusion therapy is very safe and saves many lives, the main dangers of transfusion include:  Getting an infectious disease. Developing a transfusion reaction. This is an allergic reaction to something in the blood you were given. Every precaution is taken to prevent this. The decision to have a blood transfusion has been considered carefully by your caregiver before blood is given. Blood is not given unless the benefits outweigh the risks. AFTER THE TRANSFUSION Right after receiving a blood transfusion, you will usually feel much better and more energetic. This is especially true if your red blood cells have gotten low (anemic). The transfusion raises the level of the red blood cells which carry oxygen, and this usually causes an energy increase. The nurse administering the transfusion will monitor you carefully for complications. HOME CARE INSTRUCTIONS  No special instructions are needed after a transfusion. You may find your energy is better. Speak with your caregiver about any limitations on activity for underlying diseases you may have. SEEK MEDICAL CARE IF:  Your condition is not improving after your transfusion. You develop redness or irritation at the intravenous (IV) site. SEEK IMMEDIATE MEDICAL CARE IF:  Any of the following symptoms occur over the next 12 hours: Shaking chills. You have a temperature by mouth above 102 F (38.9 C), not controlled by medicine. Chest, back, or muscle pain. People around you feel you are not acting correctly or are confused. Shortness of breath or difficulty breathing. Dizziness and fainting. You get a rash or develop hives. You have a decrease in urine output. Your urine turns a dark color or changes to pink, red, or brown. Any of the following symptoms occur over the next 10 days: You have a temperature by mouth  above 102 F (38.9 C), not controlled by medicine. Shortness of breath. Weakness after normal activity. The white part of the eye turns yellow (jaundice). You have a decrease in the amount of urine or are urinating less often. Your urine turns a dark color or changes to pink, red, or brown. Document Released: 10/05/2000 Document Revised: 12/31/2011 Document Reviewed: 05/24/2008 [facility] Patient Information 2014 [place], [place].  _______________________________________________________________________

## 2023-01-04 ENCOUNTER — Other Ambulatory Visit: Payer: Self-pay

## 2023-01-04 ENCOUNTER — Encounter (HOSPITAL_COMMUNITY): Payer: Self-pay

## 2023-01-04 ENCOUNTER — Encounter (HOSPITAL_COMMUNITY)
Admission: RE | Admit: 2023-01-04 | Discharge: 2023-01-04 | Disposition: A | Discharge status: Home / Self Care | Payer: No Typology Code available for payment source | Source: Ambulatory Visit | Attending: Orthopedic Surgery | Admitting: Orthopedic Surgery

## 2023-01-04 VITALS — BP 130/87 | HR 76 | Temp 99.0°F | Resp 16 | Ht 65.0 in | Wt 185.0 lb

## 2023-01-04 DIAGNOSIS — Z01812 Encounter for preprocedural laboratory examination: Secondary | ICD-10-CM | POA: Diagnosis not present

## 2023-01-04 DIAGNOSIS — M1612 Unilateral primary osteoarthritis, left hip: Secondary | ICD-10-CM | POA: Insufficient documentation

## 2023-01-04 DIAGNOSIS — Z01818 Encounter for other preprocedural examination: Secondary | ICD-10-CM

## 2023-01-04 HISTORY — DX: Anxiety disorder, unspecified: F41.9

## 2023-01-04 LAB — CBC
HCT: 38.1 % (ref 36.0–46.0)
Hemoglobin: 12.3 g/dL (ref 12.0–15.0)
MCH: 27.5 pg (ref 26.0–34.0)
MCHC: 32.3 g/dL (ref 30.0–36.0)
MCV: 85 fL (ref 80.0–100.0)
Platelets: 276 10*3/uL (ref 150–400)
RBC: 4.48 MIL/uL (ref 3.87–5.11)
RDW: 15.2 % (ref 11.5–15.5)
WBC: 5.4 10*3/uL (ref 4.0–10.5)
nRBC: 0 % (ref 0.0–0.2)

## 2023-01-04 LAB — SURGICAL PCR SCREEN
MRSA, PCR: NEGATIVE
Staphylococcus aureus: NEGATIVE

## 2023-01-04 LAB — BASIC METABOLIC PANEL
Anion gap: 8 (ref 5–15)
BUN: 14 mg/dL (ref 6–20)
CO2: 26 mmol/L (ref 22–32)
Calcium: 8.6 mg/dL — ABNORMAL LOW (ref 8.9–10.3)
Chloride: 105 mmol/L (ref 98–111)
Creatinine, Ser: 0.82 mg/dL (ref 0.44–1.00)
GFR, Estimated: >60 mL/min (ref >60)
Glucose, Bld: 85 mg/dL (ref 70–99)
Potassium: 4 mmol/L (ref 3.5–5.1)
Sodium: 139 mmol/L (ref 135–145)

## 2023-01-09 DIAGNOSIS — M1611 Unilateral primary osteoarthritis, right hip: Secondary | ICD-10-CM | POA: Diagnosis present

## 2023-01-09 NOTE — H&P (Signed)
TOTAL HIP ADMISSION H&P  Patient is admitted for right total hip arthroplasty.  Subjective:  Chief Complaint: right hip pain  HPI: [place], 42 y.o. female, has a history of pain and functional disability in the right hip(s) due to trauma and arthritis and patient has failed non-surgical conservative treatments for greater than 12 weeks to include NSAID's and/or analgesics, corticosteriod injections, flexibility and strengthening excercises, use of assistive devices, and activity modification.  Onset of symptoms was abrupt starting 2 years ago with rapidlly worsening course since that time.The patient noted no past surgery on the right hip(s).  Patient currently rates pain in the right hip at 10 out of 10 with activity. Patient has night pain, worsening of pain with activity and weight bearing, trendelenberg gait, pain that interfers with activities of daily living, and pain with passive range of motion. Patient has evidence of periarticular osteophytes and joint space narrowing by imaging studies. This condition presents safety issues increasing the risk of falls.  There is no current active infection.  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   Arthritis 08/13/2018   Injury of right Achilles tendon 03/10/2018   Strain of right Achilles tendon 03/10/2018   Tear of right acetabular labrum 10/01/2016   Right hip pain 10/01/2016   BV (bacterial vaginosis) 08/16/2014   Thyroid nodule 03/11/2014   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   Bone spur of acromioclavicular joint 02/10/2013   Eczema 02/10/2013   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   Past Medical History:   Diagnosis Date   Anxiety    no meds   Arthritis    Breast cancer ([place])    Cancer ([place])    right breast cancer   Eczema    Family history of breast cancer    Family history of prostate cancer    Personal history of radiation therapy 08/06/2019   stopped dec 21,21   Pneumonia    PONV (postoperative nausea and vomiting)     Past Surgical History:  Procedure Laterality Date   ANKLE ARTHROSCOPY     BREAST LUMPECTOMY Right    dcis 07/2019   BREAST LUMPECTOMY WITH RADIOACTIVE SEED LOCALIZATION Right 07/22/2019   Procedure: RIGHT BREAST LUMPECTOMY WITH BRACKETED RADIOACTIVE SEED LOCALIZATION;  Surgeon: Jovita Kussmaul, MD;  Location: [facility];  Service: General;  Laterality: Right;   HIP SURGERY Bilateral    SHOULDER SURGERY Right    TARSAL TUNNEL RELEASE Right    THROAT SURGERY     Vocal cord nodules removed   TOTAL HIP ARTHROPLASTY Left 09/03/2022   Procedure: LEFT TOTAL HIP ARTHROPLASTY ANTERIOR APPROACH;  Surgeon: Frederik Pear, MD;  Location: [facility];  Service: Orthopedics;  Laterality: Left;    No current facility-administered medications for this encounter.   Current Outpatient Medications  Medication Sig Dispense Refill Last Dose   azelastine (ASTELIN) 0.1 % nasal spray Place 1-2 sprays into both nostrils 2 (two) times daily as needed. (Patient taking differently: Place 1-2 sprays into both nostrils 2 (two) times daily as needed for allergies.) 30 mL 5    cholecalciferol (VITAMIN D3) 25 MCG (1000 UNIT) tablet Take 1 tablet (1,000  Units total) by mouth daily.      Fluocinolone Acetonide Body 0.01 % OIL Apply 1 Application topically daily as needed (allergic reaction).      levocetirizine (XYZAL) 5 MG tablet Take 5 mg by mouth daily as needed for allergies.      levonorgestrel (MIRENA) 20 MCG/DAY IUD 1 each by Intrauterine route once.      montelukast (SINGULAIR) 10 MG tablet Take 10 mg by mouth at bedtime as needed (allergies).      tamoxifen (NOLVADEX) 20 MG  tablet TAKE 1 TABLET BY MOUTH EVERY DAY 90 tablet 3    zinc gluconate 50 MG tablet Take 1 tablet (50 mg total) by mouth daily.      WEGOVY 0.25 MG/0.5ML SOAJ Inject 0.25 mg into the skin every Monday.      Allergies  Allergen Reactions   Orange Oil Itching and Other (See Comments)    Contact Dermatitis     Social History   Tobacco Use   Smoking status: Never   Smokeless tobacco: Never  Substance Use Topics   Alcohol use: Yes    Comment: social    Family History  Problem Relation Age of Onset   Prostate cancer Father    Cervical cancer Sister    Breast cancer Paternal Aunt        dx 78s   Prostate cancer Paternal Uncle        dx 71s   Esophageal cancer Maternal Grandfather    Breast cancer Paternal Grandmother    Asthma Niece    Allergic rhinitis Neg Hx    Eczema Neg Hx    Immunodeficiency Neg Hx    Atopy Neg Hx    Angioedema Neg Hx      Review of Systems  Constitutional: Negative.   HENT: Negative.    Eyes: Negative.   Respiratory: Negative.    Cardiovascular:  Positive for leg swelling.  Gastrointestinal:  Positive for constipation.  Endocrine: Negative.   Genitourinary: Negative.   Musculoskeletal:  Positive for arthralgias and myalgias.  Allergic/Immunologic: Negative.   Neurological: Negative.   Hematological: Negative.   Psychiatric/Behavioral: Negative.      Objective:  Physical Exam Constitutional:      Appearance: Normal appearance. She is normal weight.  HENT:     Head: Normocephalic and atraumatic.     Nose: Nose normal.  Eyes:     Pupils: Pupils are equal, round, and reactive to light.  Cardiovascular:     Pulses: Normal pulses.  Pulmonary:     Effort: Pulmonary effort is normal.  Musculoskeletal:        General: Tenderness present.     Cervical back: Normal range of motion and neck supple.     Comments: the patient has good strength good range of motion in the left hip.  She does notice a pinching pain with attempts of internal  rotation past approximately 30.  Tenderness laterally over the greater trochanteric region.  Calves are soft and nontender.  Patient's right hip does have continued pain with attempts of internal rotation.  Skin:    General: Skin is warm and dry.  Neurological:     General: No focal deficit present.     Mental Status: She is alert and oriented to person, place, and time. Mental status is at baseline.  Psychiatric:        Mood and Affect: Mood normal.        Behavior: Behavior normal.        Thought  Content: Thought content normal.        Judgment: Judgment normal.     Vital signs in last 24 hours:    Labs:   Estimated body mass index is 30.79 kg/m as calculated from the following:   Height as of 01/04/23: 5\' 5"  (1.651 m).   Weight as of 01/04/23: 83.9 kg.   Imaging Review Plain radiographs demonstrate AP of the pelvis and crosstable lateral of the left hip are taken and reviewed in office today.  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  Patient's left hip shows a well-placed well fixed left total hip arthroplasty with Actis stem.  She may have some heterotopic bone forming just superior and lateral to the cup.   Assessment/Plan:  End stage arthritis, right hip(s)  The patient history, physical examination, clinical judgement of the provider and imaging studies are consistent with end stage degenerative joint disease of the right hip(s) and total hip arthroplasty is deemed medically necessary. The treatment options including medical management, injection therapy, arthroscopy and arthroplasty were discussed at length. The risks and benefits of total hip arthroplasty were presented and reviewed. The risks due to aseptic loosening, infection, stiffness, dislocation/subluxation,  thromboembolic complications and other imponderables were discussed.  The patient acknowledged the explanation, agreed to proceed with the plan and consent was signed. Patient is being admitted  for inpatient treatment for surgery, pain control, PT, OT, prophylactic antibiotics, VTE prophylaxis, progressive ambulation and ADL's and discharge planning.The patient is planning to be discharged home with home health services    Patient's anticipated LOS is less than 2 midnights, meeting these requirements: - Younger than 76 - Lives within 1 hour of care - Has a competent adult at home to recover with post-op recover - NO history of  - Chronic pain requiring opiods  - Diabetes  - Coronary Artery Disease  - Heart failure  - Heart attack  - Stroke  - DVT/VTE  - Cardiac arrhythmia  - Respiratory Failure/COPD  - Renal failure  - Anemia  - Advanced Liver disease

## 2023-01-13 MED ORDER — TRANEXAMIC ACID 1000 MG/10ML IV SOLN
2000.0000 mg | INTRAVENOUS | Status: DC
  Filled 2023-01-13: qty 20

## 2023-01-13 NOTE — Anesthesia Preprocedure Evaluation (Signed)
Anesthesia Evaluation  Patient identified by MRN, date of birth, ID band Patient awake    Reviewed: Allergy & Precautions, NPO status , Patient's Chart, lab work & pertinent test results  History of Anesthesia Complications (+) PONV and history of anesthetic complications  Airway Mallampati: I  TM Distance: >3 FB Neck ROM: Full    Dental  (+) Dental Advisory Given   Pulmonary neg pulmonary ROS   Pulmonary exam normal breath sounds clear to auscultation       Cardiovascular  Rhythm:Regular Rate:Normal     Neuro/Psych  PSYCHIATRIC DISORDERS Anxiety     negative neurological ROS     GI/Hepatic negative GI ROS, Neg liver ROS,,,  Endo/Other  negative endocrine ROS    Renal/GU negative Renal ROS     Musculoskeletal  (+) Arthritis ,    Abdominal   Peds  Hematology negative hematology ROS (+)   Anesthesia Other Findings H/o right breast cancer, eczema  Last Wegovy: 01/01/2023  Platelets 276 01/04/2023  Reproductive/Obstetrics                              Anesthesia Physical Anesthesia Plan  ASA: 2  Anesthesia Plan: MAC and Spinal   Post-op Pain Management: Tylenol PO (pre-op)*   Induction: Intravenous  PONV Risk Score and Plan: 3 and Ondansetron, Dexamethasone and Propofol infusion  Airway Management Planned: Natural Airway and Simple Face Mask  Additional Equipment:   Intra-op Plan:   Post-operative Plan:   Informed Consent: I have reviewed the patients History and Physical, chart, labs and discussed the procedure including the risks, benefits and alternatives for the proposed anesthesia with the patient or authorized representative who has indicated his/her understanding and acceptance.     Dental advisory given  Plan Discussed with: CRNA and Anesthesiologist  Anesthesia Plan Comments: (I have discussed risks of neuraxial anesthesia including but not limited to infection,  bleeding, nerve injury, back pain, headache, seizures, and failure of block. Patient denies bleeding disorders and is not currently anticoagulated. Labs have been reviewed. Risks and benefits discussed. All patient's questions answered.   Discussed with patient risks of MAC including, but not limited to, minor pain or discomfort, hearing people in the room, and possible need for backup general anesthesia. Risks for general anesthesia also discussed including, but not limited to, sore throat, hoarse voice, chipped/damaged teeth, injury to vocal cords, nausea and vomiting, allergic reactions, lung infection, heart attack, stroke, and death. All questions answered. )         Anesthesia Quick Evaluation

## 2023-01-14 ENCOUNTER — Other Ambulatory Visit: Payer: Self-pay

## 2023-01-14 ENCOUNTER — Ambulatory Visit (HOSPITAL_COMMUNITY): Payer: No Typology Code available for payment source | Admitting: Anesthesiology

## 2023-01-14 ENCOUNTER — Ambulatory Visit (HOSPITAL_BASED_OUTPATIENT_CLINIC_OR_DEPARTMENT_OTHER): Payer: No Typology Code available for payment source | Admitting: Anesthesiology

## 2023-01-14 ENCOUNTER — Encounter (HOSPITAL_COMMUNITY): Payer: Self-pay | Admitting: Orthopedic Surgery

## 2023-01-14 ENCOUNTER — Encounter (HOSPITAL_COMMUNITY)
Admission: RE | Disposition: A | Discharge status: Home / Self Care | Payer: Self-pay | Source: Ambulatory Visit | Attending: Orthopedic Surgery

## 2023-01-14 ENCOUNTER — Ambulatory Visit (HOSPITAL_COMMUNITY): Payer: BC Managed Care – PPO

## 2023-01-14 ENCOUNTER — Ambulatory Visit (HOSPITAL_COMMUNITY)
Admission: RE | Admit: 2023-01-14 | Discharge: 2023-01-14 | Disposition: A | Discharge status: Home / Self Care | Payer: No Typology Code available for payment source | Source: Ambulatory Visit | Attending: Orthopedic Surgery | Admitting: Orthopedic Surgery

## 2023-01-14 DIAGNOSIS — M25751 Osteophyte, right hip: Secondary | ICD-10-CM | POA: Insufficient documentation

## 2023-01-14 DIAGNOSIS — Z01818 Encounter for other preprocedural examination: Secondary | ICD-10-CM

## 2023-01-14 DIAGNOSIS — M1611 Unilateral primary osteoarthritis, right hip: Secondary | ICD-10-CM

## 2023-01-14 HISTORY — PX: TOTAL HIP ARTHROPLASTY: SHX124

## 2023-01-14 LAB — TYPE AND SCREEN
ABO/RH(D): O POS
Antibody Screen: NEGATIVE

## 2023-01-14 LAB — POCT PREGNANCY, URINE: Preg Test, Ur: NEGATIVE

## 2023-01-14 SURGERY — ARTHROPLASTY, HIP, TOTAL, ANTERIOR APPROACH
Anesthesia: Monitor Anesthesia Care | Site: Hip | Laterality: Right

## 2023-01-14 MED ORDER — HYDROMORPHONE HCL 1 MG/ML IJ SOLN: 0.5000 mg | INTRAMUSCULAR | Status: DC | PRN

## 2023-01-14 MED ORDER — ONDANSETRON HCL 4 MG/2ML IJ SOLN: 4.0000 mg | Freq: Four times a day (QID) | INTRAMUSCULAR | Status: DC | PRN

## 2023-01-14 MED ORDER — TIZANIDINE HCL 2 MG PO CAPS: 2.0000 mg | ORAL_CAPSULE | Freq: Three times a day (TID) | ORAL | 0 refills | Status: DC | PRN

## 2023-01-14 MED ORDER — DEXAMETHASONE SODIUM PHOSPHATE 10 MG/ML IJ SOLN
INTRAMUSCULAR | Status: AC
  Filled 2023-01-14: qty 1

## 2023-01-14 MED ORDER — ONDANSETRON HCL 4 MG/2ML IJ SOLN
INTRAMUSCULAR | Status: DC | PRN
  Administered 2023-01-14: 4 mg via INTRAVENOUS

## 2023-01-14 MED ORDER — OXYCODONE HCL 5 MG PO TABS
5.0000 mg | ORAL_TABLET | ORAL | Status: DC | PRN
  Administered 2023-01-14: 10 mg via ORAL

## 2023-01-14 MED ORDER — LEVOCETIRIZINE DIHYDROCHLORIDE 5 MG PO TABS: 5.0000 mg | ORAL_TABLET | Freq: Every day | ORAL | Status: DC | PRN

## 2023-01-14 MED ORDER — POLYETHYLENE GLYCOL 3350 17 G PO PACK: 17.0000 g | PACK | Freq: Every day | ORAL | Status: DC | PRN

## 2023-01-14 MED ORDER — CHLORHEXIDINE GLUCONATE 0.12 % MT SOLN
15.0000 mL | Freq: Once | OROMUCOSAL | Status: AC
  Administered 2023-01-14: 15 mL via OROMUCOSAL

## 2023-01-14 MED ORDER — DIPHENHYDRAMINE HCL 50 MG/ML IJ SOLN
12.5000 mg | Freq: Once | INTRAMUSCULAR | Status: AC
  Administered 2023-01-14: 12.5 mg via INTRAVENOUS

## 2023-01-14 MED ORDER — MENTHOL 3 MG MT LOZG: 1.0000 | LOZENGE | OROMUCOSAL | Status: DC | PRN

## 2023-01-14 MED ORDER — LIDOCAINE HCL (PF) 2 % IJ SOLN
INTRAMUSCULAR | Status: AC
  Filled 2023-01-14: qty 5

## 2023-01-14 MED ORDER — BUPIVACAINE LIPOSOME 1.3 % IJ SUSP
INTRAMUSCULAR | Status: DC | PRN
  Administered 2023-01-14: 20 mL

## 2023-01-14 MED ORDER — POVIDONE-IODINE 10 % EX SWAB: 2.0000 | Freq: Once | CUTANEOUS | Status: DC

## 2023-01-14 MED ORDER — ASPIRIN 81 MG PO CHEW: 81.0000 mg | CHEWABLE_TABLET | Freq: Two times a day (BID) | ORAL | Status: DC

## 2023-01-14 MED ORDER — TAMOXIFEN CITRATE 20 MG PO TABS: 20.0000 mg | ORAL_TABLET | Freq: Every day | ORAL | Status: DC

## 2023-01-14 MED ORDER — MEPERIDINE HCL 50 MG/ML IJ SOLN
INTRAMUSCULAR | Status: AC
  Filled 2023-01-14: qty 1

## 2023-01-14 MED ORDER — PROPOFOL 500 MG/50ML IV EMUL
INTRAVENOUS | Status: DC | PRN
  Administered 2023-01-14: 100 ug/kg/min via INTRAVENOUS

## 2023-01-14 MED ORDER — HYDROMORPHONE HCL 2 MG PO TABS: 2.0000 mg | ORAL_TABLET | ORAL | Status: DC | PRN

## 2023-01-14 MED ORDER — OXYCODONE HCL 5 MG/5ML PO SOLN: 5.0000 mg | Freq: Once | ORAL | Status: DC | PRN

## 2023-01-14 MED ORDER — LACTATED RINGERS IV BOLUS: 250.0000 mL | Freq: Once | INTRAVENOUS | Status: DC

## 2023-01-14 MED ORDER — FENTANYL CITRATE PF 50 MCG/ML IJ SOSY: 25.0000 ug | PREFILLED_SYRINGE | INTRAMUSCULAR | Status: DC | PRN

## 2023-01-14 MED ORDER — OXYCODONE HCL 5 MG PO TABS: 5.0000 mg | ORAL_TABLET | Freq: Once | ORAL | Status: DC | PRN

## 2023-01-14 MED ORDER — OXYCODONE HCL 5 MG PO TABS: 5.0000 mg | ORAL_TABLET | ORAL | 0 refills | Status: AC | PRN

## 2023-01-14 MED ORDER — ZINC GLUCONATE 50 MG PO TABS: 50.0000 mg | ORAL_TABLET | Freq: Every day | ORAL | Status: DC

## 2023-01-14 MED ORDER — METHOCARBAMOL 500 MG IVPB - SIMPLE MED
500.0000 mg | Freq: Once | INTRAVENOUS | Status: AC
  Administered 2023-01-14: 500 mg via INTRAVENOUS

## 2023-01-14 MED ORDER — PHENOL 1.4 % MT LIQD: 1.0000 | OROMUCOSAL | Status: DC | PRN

## 2023-01-14 MED ORDER — PHENYLEPHRINE HCL (PRESSORS) 10 MG/ML IV SOLN
INTRAVENOUS | Status: DC | PRN
  Administered 2023-01-14 (×2): 80 ug via INTRAVENOUS
  Administered 2023-01-14: 100 ug via INTRAVENOUS
  Administered 2023-01-14: 80 ug via INTRAVENOUS

## 2023-01-14 MED ORDER — CEFAZOLIN SODIUM-DEXTROSE 2-4 GM/100ML-% IV SOLN
2.0000 g | INTRAVENOUS | Status: AC
  Administered 2023-01-14: 2 g via INTRAVENOUS
  Filled 2023-01-14: qty 100

## 2023-01-14 MED ORDER — ONDANSETRON HCL 4 MG/2ML IJ SOLN
INTRAMUSCULAR | Status: AC
  Filled 2023-01-14: qty 2

## 2023-01-14 MED ORDER — DIPHENHYDRAMINE HCL 50 MG/ML IJ SOLN
INTRAMUSCULAR | Status: AC
  Filled 2023-01-14: qty 1

## 2023-01-14 MED ORDER — STERILE WATER FOR INJECTION IJ SOLN
INTRAMUSCULAR | Status: DC | PRN
  Administered 2023-01-14: 2000 mL

## 2023-01-14 MED ORDER — TRANEXAMIC ACID-NACL 1000-0.7 MG/100ML-% IV SOLN
1000.0000 mg | INTRAVENOUS | Status: AC
  Administered 2023-01-14: 1000 mg via INTRAVENOUS
  Filled 2023-01-14: qty 100

## 2023-01-14 MED ORDER — KCL IN DEXTROSE-NACL 20-5-0.45 MEQ/L-%-% IV SOLN: INTRAVENOUS | Status: DC

## 2023-01-14 MED ORDER — PROPOFOL 500 MG/50ML IV EMUL
INTRAVENOUS | Status: AC
  Filled 2023-01-14: qty 50

## 2023-01-14 MED ORDER — BUPIVACAINE-EPINEPHRINE (PF) 0.25% -1:200000 IJ SOLN
INTRAMUSCULAR | Status: AC
  Filled 2023-01-14: qty 30

## 2023-01-14 MED ORDER — PANTOPRAZOLE SODIUM 40 MG PO TBEC: 40.0000 mg | DELAYED_RELEASE_TABLET | Freq: Every day | ORAL | Status: DC

## 2023-01-14 MED ORDER — LEVONORGESTREL 20 MCG/DAY IU IUD: 1.0000 | INTRAUTERINE_SYSTEM | Freq: Once | INTRAUTERINE | Status: DC

## 2023-01-14 MED ORDER — BUPIVACAINE-EPINEPHRINE 0.25% -1:200000 IJ SOLN
INTRAMUSCULAR | Status: DC | PRN
  Administered 2023-01-14: 30 mL

## 2023-01-14 MED ORDER — BUPIVACAINE IN DEXTROSE 0.75-8.25 % IT SOLN
INTRATHECAL | Status: DC | PRN
  Administered 2023-01-14: 1.8 mL via INTRATHECAL

## 2023-01-14 MED ORDER — DIPHENHYDRAMINE HCL 12.5 MG/5ML PO ELIX: 12.5000 mg | ORAL_SOLUTION | ORAL | Status: DC | PRN

## 2023-01-14 MED ORDER — ORAL CARE MOUTH RINSE: 15.0000 mL | Freq: Once | OROMUCOSAL | Status: AC

## 2023-01-14 MED ORDER — TRANEXAMIC ACID 1000 MG/10ML IV SOLN
INTRAVENOUS | Status: DC | PRN
  Administered 2023-01-14: 2000 mg via TOPICAL

## 2023-01-14 MED ORDER — BUPIVACAINE LIPOSOME 1.3 % IJ SUSP: 10.0000 mL | Freq: Once | INTRAMUSCULAR | Status: DC

## 2023-01-14 MED ORDER — DEXAMETHASONE SODIUM PHOSPHATE 10 MG/ML IJ SOLN: 10.0000 mg | Freq: Once | INTRAMUSCULAR | Status: DC

## 2023-01-14 MED ORDER — DOCUSATE SODIUM 100 MG PO CAPS: 100.0000 mg | ORAL_CAPSULE | Freq: Two times a day (BID) | ORAL | Status: DC

## 2023-01-14 MED ORDER — METHOCARBAMOL 500 MG IVPB - SIMPLE MED
INTRAVENOUS | Status: AC
  Filled 2023-01-14: qty 55

## 2023-01-14 MED ORDER — FLUOCINOLONE ACETONIDE BODY 0.01 % EX OIL: 1.0000 | TOPICAL_OIL | Freq: Every day | CUTANEOUS | Status: DC | PRN

## 2023-01-14 MED ORDER — VITAMIN D 25 MCG (1000 UNIT) PO TABS: 1000.0000 [IU] | ORAL_TABLET | Freq: Every day | ORAL | Status: DC

## 2023-01-14 MED ORDER — ALBUMIN HUMAN 5 % IV SOLN
INTRAVENOUS | Status: AC
  Filled 2023-01-14: qty 250

## 2023-01-14 MED ORDER — PROPOFOL 1000 MG/100ML IV EMUL
INTRAVENOUS | Status: AC
  Filled 2023-01-14: qty 100

## 2023-01-14 MED ORDER — ONDANSETRON HCL 4 MG PO TABS: 4.0000 mg | ORAL_TABLET | Freq: Four times a day (QID) | ORAL | Status: DC | PRN

## 2023-01-14 MED ORDER — SODIUM CHLORIDE 0.9% FLUSH
INTRAVENOUS | Status: DC | PRN
  Administered 2023-01-14: 50 mL

## 2023-01-14 MED ORDER — LACTATED RINGERS IV SOLN: INTRAVENOUS | Status: DC

## 2023-01-14 MED ORDER — LIDOCAINE HCL (PF) 2 % IJ SOLN
INTRAMUSCULAR | Status: DC | PRN
  Administered 2023-01-14: 60 mg via INTRADERMAL

## 2023-01-14 MED ORDER — METOCLOPRAMIDE HCL 5 MG/ML IJ SOLN: 5.0000 mg | Freq: Three times a day (TID) | INTRAMUSCULAR | Status: DC | PRN

## 2023-01-14 MED ORDER — METOCLOPRAMIDE HCL 5 MG PO TABS: 5.0000 mg | ORAL_TABLET | Freq: Three times a day (TID) | ORAL | Status: DC | PRN

## 2023-01-14 MED ORDER — ACETAMINOPHEN 500 MG PO TABS
1000.0000 mg | ORAL_TABLET | Freq: Once | ORAL | Status: AC
  Administered 2023-01-14: 1000 mg via ORAL
  Filled 2023-01-14: qty 2

## 2023-01-14 MED ORDER — BUPIVACAINE LIPOSOME 1.3 % IJ SUSP
INTRAMUSCULAR | Status: AC
  Filled 2023-01-14: qty 20

## 2023-01-14 MED ORDER — MIDAZOLAM HCL 2 MG/2ML IJ SOLN
INTRAMUSCULAR | Status: AC
  Filled 2023-01-14: qty 2

## 2023-01-14 MED ORDER — LACTATED RINGERS IV BOLUS
500.0000 mL | Freq: Once | INTRAVENOUS | Status: AC
  Administered 2023-01-14: 500 mL via INTRAVENOUS

## 2023-01-14 MED ORDER — KETOROLAC TROMETHAMINE 30 MG/ML IJ SOLN
INTRAMUSCULAR | Status: AC
  Filled 2023-01-14: qty 1

## 2023-01-14 MED ORDER — ALUM & MAG HYDROXIDE-SIMETH 200-200-20 MG/5ML PO SUSP: 30.0000 mL | ORAL | Status: DC | PRN

## 2023-01-14 MED ORDER — ALBUMIN HUMAN 5 % IV SOLN: INTRAVENOUS | Status: DC | PRN

## 2023-01-14 MED ORDER — AMISULPRIDE (ANTIEMETIC) 5 MG/2ML IV SOLN: 10.0000 mg | Freq: Once | INTRAVENOUS | Status: DC | PRN

## 2023-01-14 MED ORDER — AZELASTINE HCL 0.1 % NA SOLN: 1.0000 | Freq: Two times a day (BID) | NASAL | Status: DC | PRN

## 2023-01-14 MED ORDER — KETOROLAC TROMETHAMINE 30 MG/ML IJ SOLN
30.0000 mg | Freq: Once | INTRAMUSCULAR | Status: AC | PRN
  Administered 2023-01-14: 30 mg via INTRAVENOUS

## 2023-01-14 MED ORDER — SEMAGLUTIDE-WEIGHT MANAGEMENT 0.25 MG/0.5ML ~~LOC~~ SOAJ: 0.2500 mg | SUBCUTANEOUS | Status: DC

## 2023-01-14 MED ORDER — SODIUM CHLORIDE (PF) 0.9 % IJ SOLN
INTRAMUSCULAR | Status: AC
  Filled 2023-01-14: qty 50

## 2023-01-14 MED ORDER — TRANEXAMIC ACID-NACL 1000-0.7 MG/100ML-% IV SOLN: 1000.0000 mg | Freq: Once | INTRAVENOUS | Status: DC

## 2023-01-14 MED ORDER — PROPOFOL 10 MG/ML IV BOLUS
INTRAVENOUS | Status: DC | PRN
  Administered 2023-01-14: 30 mg via INTRAVENOUS
  Administered 2023-01-14: 60 mg via INTRAVENOUS
  Administered 2023-01-14: 40 mg via INTRAVENOUS

## 2023-01-14 MED ORDER — ACETAMINOPHEN 325 MG PO TABS: 325.0000 mg | ORAL_TABLET | Freq: Four times a day (QID) | ORAL | Status: DC | PRN

## 2023-01-14 MED ORDER — MONTELUKAST SODIUM 10 MG PO TABS: 10.0000 mg | ORAL_TABLET | Freq: Every evening | ORAL | Status: DC | PRN

## 2023-01-14 MED ORDER — MEPERIDINE HCL 50 MG/ML IJ SOLN
6.2500 mg | INTRAMUSCULAR | Status: DC | PRN
  Administered 2023-01-14: 12.5 mg via INTRAVENOUS

## 2023-01-14 MED ORDER — 0.9 % SODIUM CHLORIDE (POUR BTL) OPTIME
TOPICAL | Status: DC | PRN
  Administered 2023-01-14: 1000 mL

## 2023-01-14 MED ORDER — DIPHENHYDRAMINE HCL 25 MG PO CAPS
ORAL_CAPSULE | ORAL | Status: AC
  Filled 2023-01-14: qty 1

## 2023-01-14 MED ORDER — OXYCODONE HCL 5 MG PO TABS
ORAL_TABLET | ORAL | Status: AC
  Filled 2023-01-14: qty 2

## 2023-01-14 MED ORDER — BISACODYL 5 MG PO TBEC: 5.0000 mg | DELAYED_RELEASE_TABLET | Freq: Every day | ORAL | Status: DC | PRN

## 2023-01-14 MED ORDER — MIDAZOLAM HCL 5 MG/5ML IJ SOLN
INTRAMUSCULAR | Status: DC | PRN
  Administered 2023-01-14 (×2): 2 mg via INTRAVENOUS

## 2023-01-14 MED ORDER — PHENYLEPHRINE 80 MCG/ML (10ML) SYRINGE FOR IV PUSH (FOR BLOOD PRESSURE SUPPORT)
PREFILLED_SYRINGE | INTRAVENOUS | Status: AC
  Filled 2023-01-14: qty 10

## 2023-01-14 MED ORDER — FLEET ENEMA 7-19 GM/118ML RE ENEM: 1.0000 | ENEMA | Freq: Once | RECTAL | Status: DC | PRN

## 2023-01-14 MED ORDER — ASPIRIN 81 MG PO TBEC: 81.0000 mg | DELAYED_RELEASE_TABLET | Freq: Every day | ORAL | 2 refills | Status: AC

## 2023-01-14 SURGICAL SUPPLY — 44 items
BAG COUNTER SPONGE SURGICOUNT (BAG) ×1 IMPLANT
BAG DECANTER FOR FLEXI CONT (MISCELLANEOUS) ×2 IMPLANT
BAG SPNG CNTER NS LX DISP (BAG) ×1
BALL HIP CERAMIC (Hips) IMPLANT
BLADE SAW SGTL 18X1.27X75 (BLADE) ×1 IMPLANT
COVER PERINEAL POST (MISCELLANEOUS) ×1 IMPLANT
COVER SURGICAL LIGHT HANDLE (MISCELLANEOUS) ×1 IMPLANT
DRAPE STERI IOBAN 125X83 (DRAPES) ×1 IMPLANT
DRAPE U-SHAPE 47X51 STRL (DRAPES) ×2 IMPLANT
DRSG AQUACEL AG ADV 3.5X10 (GAUZE/BANDAGES/DRESSINGS) ×1 IMPLANT
DURAPREP 26ML APPLICATOR (WOUND CARE) ×1 IMPLANT
ELECT BLADE TIP CTD 4 INCH (ELECTRODE) ×1 IMPLANT
ELECT REM PT RETURN 15FT ADLT (MISCELLANEOUS) ×1 IMPLANT
ELIMINATOR HOLE APEX DEPUY (Hips) IMPLANT
GLOVE BIO SURGEON STRL SZ7.5 (GLOVE) ×1 IMPLANT
GLOVE BIO SURGEON STRL SZ8.5 (GLOVE) ×1 IMPLANT
GLOVE BIOGEL PI IND STRL 8 (GLOVE) ×1 IMPLANT
GLOVE BIOGEL PI IND STRL 9 (GLOVE) ×1 IMPLANT
GOWN STRL REUS W/ TWL XL LVL3 (GOWN DISPOSABLE) ×2 IMPLANT
GOWN STRL REUS W/TWL XL LVL3 (GOWN DISPOSABLE) ×2
HIP BALL CERAMIC (Hips) ×1 IMPLANT
HOLDER FOLEY CATH W/STRAP (MISCELLANEOUS) ×1 IMPLANT
KIT TURNOVER KIT A (KITS) IMPLANT
LINER ACET PNNCL PLUS4 NEUTRAL (Hips) IMPLANT
MANIFOLD NEPTUNE II (INSTRUMENTS) ×1 IMPLANT
NDL HYPO 21X1.5 SAFETY (NEEDLE) ×2 IMPLANT
NEEDLE HYPO 21X1.5 SAFETY (NEEDLE) ×2 IMPLANT
NS IRRIG 1000ML POUR BTL (IV SOLUTION) ×1 IMPLANT
PACK ANTERIOR HIP CUSTOM (KITS) ×1 IMPLANT
PIN SECT CUP 50MM (Hips) IMPLANT
PINNACLE PLUS 4 NEUTRAL (Hips) ×1 IMPLANT
SPIKE FLUID TRANSFER (MISCELLANEOUS) ×1 IMPLANT
STEM FEM ACTIS STD SZ2 (Stem) IMPLANT
SUT VIC AB 0 CT1 27 (SUTURE) ×1
SUT VIC AB 0 CT1 27XBRD ANBCTR (SUTURE) ×1 IMPLANT
SUT VIC AB 1 CTX 36 (SUTURE) ×1
SUT VIC AB 1 CTX36XBRD ANBCTR (SUTURE) ×1 IMPLANT
SUT VIC AB 2-0 CT1 27 (SUTURE) ×1
SUT VIC AB 2-0 CT1 TAPERPNT 27 (SUTURE) ×1 IMPLANT
SUT VIC AB 3-0 CT1 27 (SUTURE) ×1
SUT VIC AB 3-0 CT1 TAPERPNT 27 (SUTURE) ×1 IMPLANT
SYR CONTROL 10ML LL (SYRINGE) ×3 IMPLANT
TRAY FOLEY MTR SLVR 16FR STAT (SET/KITS/TRAYS/PACK) IMPLANT
TUBE SUCTION HIGH CAP CLEAR NV (SUCTIONS) ×1 IMPLANT

## 2023-01-14 NOTE — Op Note (Signed)
PATIENT ID:      Heather Delacruz  MRN:     [id number] DOB/AGE:    07-09-1981 / 42 y.o.  OPERATIVE REPORT   DATE OF PROCEDURE:  01/14/2023      PREOPERATIVE DIAGNOSIS:  RIGHT HIP OSTEOARTHRITIS                                                         POSTOPERATIVE DIAGNOSIS:  Same                                                         PROCEDURE: Anterior R total hip arthroplasty using a 50 mm DePuy Pinnacle  Cup, [employer], 0-degree polyethylene liner, a +5 mm x 48mm ceramic head, a 2 std Depuy Actis stem  SURGEON: Kerin Salen  ASSISTANT:   Kerry Hough. [employer]  (present throughout entire procedure and necessary for timely completion of the procedure)   ANESTHESIA: Spinal, Exparel 133mg  injection BLOOD LOSS: 500 cc FLUID REPLACEMENT: 1600 cc crystalloid TRANEXAMIC ACID: 1gm IV, 2gm Topical COMPLICATIONS: none    INDICATIONS FOR PROCEDURE: A 42 y.o. year-old With  RIGHT HIP OSTEOARTHRITIS   for 2 years, x-rays show bone-on-bone arthritic changes, and osteophytes. Despite conservative measures with observation, anti-inflammatory medicine, narcotics, use of a cane, has severe unremitting pain and can ambulate only a few blocks before resting. Patient desires elective R total hip arthroplasty to decrease pain and increase function. The risks, benefits, and alternatives were discussed at length including but not limited to the risks of infection, bleeding, nerve injury, stiffness, blood clots, the need for revision surgery, cardiopulmonary complications, among others, and they were willing to proceed. Questions answered      PROCEDURE IN DETAIL: The patient was identified by armband, received preoperative IV antibiotics, in the holding area at [facility], taken to the operating room , appropriate anesthetic monitors were attached and anesthesia was induced with the patient on the gurney. HANA boots were applied to the feet, and the patient  was transferred to the HANA  table with a peroneal post and support underneath the non-operative leg. The operative lower extremity was then prepped and draped in the usual sterile fashion from just above the iliac crest to the knee. And a timeout procedure was performed. Kerry Hough. Hardin Negus [facility] was present and scrubbed throughout the case, critical for assistance with, positioning, exposure, retraction, instrumentation, and closure.Skin along incision area was injected with 10 cc of Exparel solution. We then made a 14 cm incision along the interval at the leading edge of the tensor fascia lata of starting at 2 cm lateral to the ASIS. Small bleeders in the skin and subcutaneous tissue identified and cauterized we dissected down to the fascia and made an incision in the fascia allowing [place] to elevate the fascia of the tensor muscle and exploited the interval between the rectus and the tensor fascia lata. A Cobra retractor was then placed along the superior neck of the femur. A cerebellar retractor was used to expose the interval between the tensor fascia lata and the rectus femoris.  We identified and cauterized the  ascending branch of the anterior circumflex artery. A second Cobra retractor along the inferior neck of the femur. A small Hohmann retractor was placed underneath the origin of the rectus femoris, giving [place] good medial exposure. Using Ronguers fatty tissue was removed from in front of the anterior capsule. The capsule was then incised, starting out at the superior anterior rim of the acetabulum going laterally along the anterior neck. The capsule was then teed along the neck superiorly and inferiorly. Electrocautery was used to release capsule from the anterior and medial neck of the femur to allow external rotation. The cobra retractors were then placed along the inferior and superior neck. The hip was externally rotated to 40 degrees, traction applied and locked. We perform a standard neck cut with the oscillating saw and removed the  femoral head with a power corkscrew. We then placed a medium curved medium homan retractor in the cotyloid notch and standard cobra retractor posteriorly along the acetabular rim. Exposed labral tissue and osteophytes were then removed. We then sequentially reamed up to a 50 mm basket reamer obtaining good coverage in all quadrants, verified by C-arm imaging. Under C-arm control we then hammered into place a 50 mm Pinnacle cup in 45 of abduction and 15 of anteversion. The cup seated nicely and required no supplemental screws. We then placed a central hole Eliminator and a +4 mm, 0 polyethylene liner. The foot was then externally rotated to 130-140. The limb was extended and adducted to the floor, delivering the proximal femur up into the wound. A medium curved Hohmann retractor was placed over the greater trochanter and a long Homan retractor along the posterior femoral neck completing the exposure and lateralizing the femur. We then performed releases superiorly and and inferiorly of the capsule going back to the pirformis fossa superiorly and to the lesser trochanter inferiorly. We then entered the proximal femur with the box cutting offset chisel followed by, a canal sounder, the chili pepper and broaching up to a 2 broach. This seated nicely and we reamed the calcar. A trial reduction was performed with a 1 mm X 32 mm head.The limb lengths were checked by c-arm, and the hip was stable in 90 of external rotation. At this point the trial components removed and we hammered into place a std  Offset # 2Actis stem with coating. A + 5 mm x 32 head was then hammered into place. The hip was reduced and final C-arm images obtained. The wound was thoroughly irrigated with normal saline solution. We repaired the ant capsule and the tensor fascia lot a with running 0 vicryl suture. the subcutaneous and subcuticular layers were closed with running 3-0 Vicryl suture followed by an Aquacil dressing. At this point the  patient was awaken and transferred to hospital gurney without difficulty.   Kerin Salen 01/14/2023, 7:08 AM

## 2023-01-14 NOTE — Anesthesia Procedure Notes (Signed)
Spinal  Patient location during procedure: OR Start time: 01/14/2023 7:26 AM End time: 01/14/2023 7:27 AM Reason for block: surgical anesthesia Staffing Performed: anesthesiologist  Anesthesiologist: Nilda Simmer, MD Performed by: Nilda Simmer, MD Authorized by: Nilda Simmer, MD   Preanesthetic Checklist Completed: patient identified, IV checked, site marked, risks and benefits discussed, surgical consent, monitors and equipment checked, pre-op evaluation and timeout performed Spinal Block Patient position: sitting Prep: DuraPrep Patient monitoring: blood pressure and continuous pulse ox Approach: midline Location: L3-4 Injection technique: single-shot Needle Needle type: Pencan  Needle gauge: 24 G Needle length: 9 cm Additional Notes Risks and benefits of neuraxial anesthesia including, but not limited to, infection, bleeding, local anesthetic toxicity, headache, hypotension, back pain, block failure, etc. were discussed with the patient. The patient expressed understanding and consented to the procedure. I confirmed that the patient has no bleeding disorders and is not taking blood thinners. I confirmed the patient's last platelet count with the nurse. Monitors were applied. A time-out was performed immediately prior to the procedure. Sterile technique was used throughout the whole procedure.   _1__ attempt(s)

## 2023-01-14 NOTE — OR Nursing (Signed)
IN AND OUT CATH AT END OF SURGERY 50ML

## 2023-01-14 NOTE — Discharge Instructions (Signed)

## 2023-01-14 NOTE — Evaluation (Signed)
Physical Therapy Evaluation Patient Details Name: Heather Delacruz MRN: [id number] DOB: [birth year] Today's Date: 01/14/2023  History of Present Illness  Pt is a 42 yo female presenting s/p R THA, AA on 01/14/2023. Pt has PMH including but not limited to: R breast cancer s/p lumpectomy and radiation, PNA, R ankle arthroplasty, and L THA (09/03/2022).  Clinical Impression    Heather Delacruz is a 42 y.o. female POD 0 s/p R THA, AA. Patient reports IND with mobility at baseline. Patient is now limited by functional impairments (see PT problem list below) and requires S for transfers and gait with RW. Patient was able to ambulate 50 and 40 feet with RW and min guard and progressing to S and cues for safe walker management. Patient educated on safe sequencing for stair mobility, car transfers, pain management and pt verbalized understanding safe guarding position for people assisting with mobility. Patient instructed in exercises to facilitate ROM and circulation reviewed with HO provided. Patient will benefit from continued skilled PT interventions to address impairments and progress towards PLOF. Patient has met mobility goals at adequate level for discharge home with family support and pt states will have [facility] services; will continue to follow if pt continues acute stay to progress towards Mod I goals.      Recommendations for follow up therapy are one component of a multi-disciplinary discharge planning process, led by the attending physician.  Recommendations may be updated based on patient status, additional functional criteria and insurance authorization.  Follow Up Recommendations       Assistance Recommended at Discharge Intermittent Supervision/Assistance  Patient can return home with the following  A little help with walking and/or transfers;A little help with bathing/dressing/bathroom;Assistance with cooking/housework;Assist for transportation;Help with stairs or ramp for entrance     Equipment Recommendations None recommended by PT (pt reports DME in home setting)  Recommendations for Other Services       Functional Status Assessment Patient has had a recent decline in their functional status and demonstrates the ability to make significant improvements in function in a reasonable and predictable amount of time.     Precautions / Restrictions Precautions Precautions: Fall Restrictions Weight Bearing Restrictions: No      Mobility  Bed Mobility Overal bed mobility: Needs Assistance Bed Mobility: Supine to Sit     Supine to sit: Supervision          Transfers Overall transfer level: Needs assistance Equipment used: Rolling walker (2 wheels) Transfers: Sit to/from Stand Sit to Stand: Supervision           General transfer comment: S and min cues at RW level for bed, commode and recliner transfers    Ambulation/Gait Ambulation/Gait assistance: Min guard (and progressed to S) Gait Distance (Feet): 50 Feet Assistive device: Rolling walker (2 wheels) Gait Pattern/deviations: Step-to pattern, Antalgic (lateral sway noted) Gait velocity: decreased        Stairs Stairs: Yes Stairs assistance: Min guard Stair Management: Two rails Number of Stairs: 5 General stair comments: 2 steps with B handrial and remaining step naviation with R handrial to emulate home setting   pt is electing to perform reciprocal pattern  Wheelchair Mobility    Modified Rankin (Stroke Patients Only)       Balance Overall balance assessment: Needs assistance Sitting-balance support: Feet unsupported, Single extremity supported Sitting balance-Leahy Scale: Fair     Standing balance support: No upper extremity supported (static stnading, B UE support at [facility] for gait tasks)  Pertinent Vitals/Pain Pain Assessment Pain Assessment: 0-10 Pain Score: 5  Pain Location: R hip Pain Descriptors / Indicators: Aching, Burning,  Constant, Operative site guarding Pain Intervention(s): Limited activity within patient's tolerance, Monitored during session, Premedicated before session, Ice applied    Home Living Family/patient expects to be discharged to:: Private residence Living Arrangements: Spouse/significant other;Children Available Help at Discharge: Family;Available 24 hours/day Type of Home: House Home Access: Stairs to enter Entrance Stairs-Rails: Right;Left;Can reach both Entrance Stairs-Number of Steps: 4 Alternate Level Stairs-Number of Steps: 14 Home Layout: Two level;Bed/bath upstairs Home Equipment: Advice worker (2 wheels);Cane - single point      Prior Function Prior Level of Function : Independent/Modified Independent;Driving;Working/employed (Working as a PT)             Mobility Comments: IND with all ADLs, self care tasks, IADLs, driving and working ADLs Comments: IND     Hand Dominance   Dominant Hand: Right    Extremity/Trunk Assessment        Lower Extremity Assessment Lower Extremity Assessment: RLE deficits/detail RLE Deficits / Details: ankle DF/PF 5/5 RLE Sensation: WNL (pt indicates some residual numbness in grion)       Communication   Communication: No difficulties  Cognition Arousal/Alertness: Awake/alert Behavior During Therapy: WFL for tasks assessed/performed Overall Cognitive Status: Within Functional Limits for tasks assessed                                          General Comments      Exercises Total Joint Exercises Ankle Circles/Pumps: AROM, Both, 20 reps Hip ABduction/ADduction: Other (comment) (reviewed standing verbally and HO) Long Arc Quad: AROM, Right, 5 reps Knee Flexion: Other (comment) (standing reviewed verbally and HO) Marching in Standing: AROM, Both, Other (comment) (3) Standing Hip Extension: Other (comment) (reviewed verally and HO)   Assessment/Plan    PT Assessment Patient needs continued PT  services  PT Problem List Decreased strength;Decreased activity tolerance;Decreased balance;Decreased mobility;Decreased coordination;Pain       PT Treatment Interventions DME instruction;Gait training;Stair training;Functional mobility training;Therapeutic activities;Therapeutic exercise;Balance training;Neuromuscular re-education;Patient/family education;Modalities    PT Goals (Current goals can be found in the Care Plan section)  Acute Rehab PT Goals Patient Stated Goal: to be able to walk with more normalized pattern PT Goal Formulation: With patient Time For Goal Achievement: 01/29/23 Potential to Achieve Goals: Good    Frequency       Co-evaluation               AM-PAC PT "6 Clicks" Mobility  Outcome Measure Help needed turning from your back to your side while in a flat bed without using bedrails?: None Help needed moving from lying on your back to sitting on the side of a flat bed without using bedrails?: None Help needed moving to and from a bed to a chair (including a wheelchair)?: A Little Help needed standing up from a chair using your arms (e.g., wheelchair or bedside chair)?: A Little Help needed to walk in hospital room?: A Little Help needed climbing 3-5 steps with a railing? : A Little 6 Click Score: 20    End of Session Equipment Utilized During Treatment: Gait belt Activity Tolerance: Patient tolerated treatment well (minimal pain increase) Patient left: in chair;with call bell/phone within reach;with family/visitor present Nurse Communication: Mobility status;Patient requests pain meds;Other (comment) (progression toward d/c) PT Visit Diagnosis: Unsteadiness on feet (  R26.81);Other abnormalities of gait and mobility (R26.89);Muscle weakness (generalized) (M62.81);Pain Pain - Right/Left: Right Pain - part of body: Hip    Time: [id number] PT Time Calculation (min) (ACUTE ONLY): 32 min   Charges:   PT Evaluation $PT Eval Low Complexity: 1 Low PT  Treatments $Gait Training: 8-22 mins       Baird Lyons, PT   Adair Patter 01/14/2023, 1:38 PM

## 2023-01-14 NOTE — Transfer of Care (Signed)
Immediate Anesthesia Transfer of Care Note  Patient: Mayci NG Ruthann Cancer  Procedure(s) Performed: RIGHT TOTAL HIP ARTHROPLASTY ANTERIOR APPROACH (Right: Hip)  Patient Location: PACU  Anesthesia Type:Spinal  Level of Consciousness: awake, alert , oriented, and pateint uncooperative  Airway & Oxygen Therapy: Patient Spontanous Breathing and Patient connected to face mask oxygen  Post-op Assessment: Report given to RN and Post -op Vital signs reviewed and stable  Post vital signs: Reviewed and stable  Last Vitals:  Vitals Value Taken Time  BP 116/87 01/14/23 0933  Temp    Pulse 244 01/14/23 0936  Resp 17 01/14/23 0936  SpO2 82 % 01/14/23 0936  Vitals shown include unvalidated device data.  Last Pain:  Vitals:   01/14/23 0615  TempSrc: Oral  PainSc:       Patients Stated Pain Goal: 4 ([id number])  Complications: No notable events documented.

## 2023-01-14 NOTE — Interval H&P Note (Signed)
History and Physical Interval Note:  01/14/2023 7:06 AM  Heather Delacruz  has presented today for surgery, with the diagnosis of RIGHT HIP OSTEOARTHRITIS.  The various methods of treatment have been discussed with the patient and family. After consideration of risks, benefits and other options for treatment, the patient has consented to  Procedure(s): RIGHT TOTAL HIP ARTHROPLASTY ANTERIOR APPROACH (Right) as a surgical intervention.  The patient's history has been reviewed, patient examined, no change in status, stable for surgery.  I have reviewed the patient's chart and labs.  Questions were answered to the patient's satisfaction.     Kerin Salen

## 2023-01-14 NOTE — Anesthesia Postprocedure Evaluation (Signed)
Anesthesia Post Note  Patient: Heather Delacruz  Procedure(s) Performed: RIGHT TOTAL HIP ARTHROPLASTY ANTERIOR APPROACH (Right: Hip)     Patient location during evaluation: PACU Anesthesia Type: MAC and Spinal Level of consciousness: awake Pain management: pain level controlled Vital Signs Assessment: post-procedure vital signs reviewed and stable Respiratory status: spontaneous breathing, respiratory function stable and nonlabored ventilation Cardiovascular status: blood pressure returned to baseline and stable Postop Assessment: no headache, no backache and no apparent nausea or vomiting Anesthetic complications: no   No notable events documented.  Last Vitals:  Vitals:   01/14/23 1030 01/14/23 1042  BP: 130/76 (!) 123/93  Pulse: 65 64  Resp: (!) 24 12  Temp:  36.7 C  SpO2: 100% 100%    Last Pain:  Vitals:   01/14/23 1042  TempSrc: Oral  PainSc: 0-No pain                 Nilda Simmer

## 2023-01-15 ENCOUNTER — Encounter (HOSPITAL_COMMUNITY): Payer: Self-pay | Admitting: Orthopedic Surgery

## 2023-05-15 ENCOUNTER — Other Ambulatory Visit: Payer: Self-pay | Admitting: Hematology and Oncology

## 2023-05-15 DIAGNOSIS — Z853 Personal history of malignant neoplasm of breast: Secondary | ICD-10-CM

## 2023-05-15 DIAGNOSIS — Z9889 Other specified postprocedural states: Secondary | ICD-10-CM

## 2023-05-22 ENCOUNTER — Other Ambulatory Visit: Payer: Self-pay | Admitting: Hematology and Oncology

## 2023-06-25 ENCOUNTER — Ambulatory Visit
Admission: RE | Admit: 2023-06-25 | Discharge: 2023-06-25 | Disposition: A | Discharge status: Home / Self Care | Payer: BC Managed Care – PPO | Source: Ambulatory Visit | Attending: Hematology and Oncology | Admitting: Hematology and Oncology

## 2023-06-25 DIAGNOSIS — Z9889 Other specified postprocedural states: Secondary | ICD-10-CM

## 2023-06-25 DIAGNOSIS — Z853 Personal history of malignant neoplasm of breast: Secondary | ICD-10-CM

## 2023-07-18 NOTE — Assessment & Plan Note (Addendum)
06/19/2019:Baseline routine screening mammogram showed two groups of indeterminate calcifications in the UOQ of the right breast, 1.2cm and 0.8cm. 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   07/22/2019: Right lumpectomy Carolynne Edouard): high grade DCIS, clear margins.  ER PR positive   Treatment plan: 1.  Adjuvant radiation therapy 08/27/2019-10/12/2019 2.  Adjuvant tamoxifen for 5 years   Tamoxifen toxicities: Very occasional hot flashes     Breast cancer surveillance: 1.  Breast exam 07/22/2023: Benign 2. Mammogram 06/25/2023: Benign breast density category C   She lost 50 pounds on Wegovy.  She works as a Engineer, technical sales and stays fairly busy. Left Hip replacement: 09/05/22 Planning right hip replacement next year   Return to clinic in 1 year for follow-up

## 2023-07-22 ENCOUNTER — Inpatient Hospital Stay: Payer: BC Managed Care – PPO | Attending: Hematology and Oncology | Admitting: Hematology and Oncology

## 2023-07-22 VITALS — BP 128/95 | HR 80 | Temp 97.7°F | Resp 18 | Ht 65.0 in | Wt 161.5 lb

## 2023-07-22 DIAGNOSIS — Z79810 Long term (current) use of selective estrogen receptor modulators (SERMs): Secondary | ICD-10-CM | POA: Insufficient documentation

## 2023-07-22 DIAGNOSIS — Z170 Estrogen receptor positive status [ER+]: Secondary | ICD-10-CM | POA: Insufficient documentation

## 2023-07-22 DIAGNOSIS — Z923 Personal history of irradiation: Secondary | ICD-10-CM | POA: Diagnosis not present

## 2023-07-22 DIAGNOSIS — D0511 Intraductal carcinoma in situ of right breast: Secondary | ICD-10-CM | POA: Insufficient documentation

## 2023-07-22 DIAGNOSIS — R232 Flushing: Secondary | ICD-10-CM | POA: Insufficient documentation

## 2023-07-22 NOTE — Progress Notes (Signed)
Patient Care Team: Medicine, [facility] as PCP - General (Family Medicine) Griselda Miner, MD as Consulting Physician (General Surgery) Serena Croissant, MD as Consulting Physician (Hematology and Oncology) Dorothy Puffer, MD as Consulting Physician (Radiation Oncology)  DIAGNOSIS:  Encounter Diagnosis  Name Primary?   Ductal carcinoma in situ (DCIS) of right breast Yes    SUMMARY OF ONCOLOGIC HISTORY: Oncology History  Ductal carcinoma in situ (DCIS) of right breast  06/19/2019 Initial Diagnosis   Baseline routine screening mammogram showed two groups of indeterminate calcifications in the UOQ of the right breast, 1.2cm and 0.8cm. 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   07/01/2019 Genetic Testing   Negative genetic testing. No pathogenic variants identified on the [facility] Breast cancer STAT Panel + Common Hereditary Cancers Panel. The STAT Breast cancer panel offered by [facility] includes sequencing and rearrangement analysis for the following 9 genes:  ATM, BRCA1, BRCA2, CDH1, CHEK2, PALB2, PTEN, STK11 and TP53.  The Common Hereditary Cancers Panel offered by [facility] includes sequencing and/or deletion duplication testing of the following 47 genes: APC, ATM, AXIN2, BARD1, BMPR1A, BRCA1, BRCA2, BRIP1, CDH1, CDKN2A (p14ARF), CDKN2A (p16INK4a), CKD4, CHEK2, CTNNA1, DICER1, EPCAM (Deletion/duplication testing only), GREM1 (promoter region deletion/duplication testing only), KIT, MEN1, MLH1, MSH2, MSH3, MSH6, MUTYH, NBN, NF1, NHTL1, PALB2, PDGFRA, PMS2, POLD1, POLE, PTEN, RAD50, RAD51C, RAD51D, SDHB, SDHC, SDHD, SMAD4, SMARCA4. STK11, TP53, TSC1, TSC2, and VHL.  The following genes were evaluated for sequence changes only: SDHA and HOXB13 c.251G>A variant only. The report date is 06/30/2019.   07/22/2019 Surgery   Right lumpectomy Carolynne Edouard)  [phone]): 5.2 cm high grade DCIS, clear margins.  Complex sclerosing lesion ER 90%, PR 100%. No regional lymph nodes were examined.   07/22/2019 Cancer Staging   Staging form: Breast, AJCC 8th Edition - Pathologic stage from 07/22/2019: Stage 0 (pTis (DCIS), pN0, cM0, ER+, PR+)   08/27/2019 - 10/12/2019 Radiation Therapy   The patient initially received a dose of 50.4 Gy in 28 fractions to the breast using whole-breast tangent fields. This was delivered using a 3-D conformal technique. The pt received a boost delivering an additional 10 Gy in 5 fractions using a electron boost with electrons. The total dose was 60.4 Gy.    09/2019 - 09/2024 Anti-estrogen oral therapy   Tamoxifen     CHIEF COMPLIANT: Follow-up on tamoxifen therapy  Discussed the use of AI scribe software for clinical note transcription with the patient, who gave verbal consent to proceed.  History of Present Illness   The patient, diagnosed with DCIS four years ago, has been on Tamoxifen therapy with one year remaining. She reports occasional hot flashes but finds them manageable. She admits to occasionally forgetting to take the medication, usually when out late at night, but overall compliance is consistent. She denies any leg cramps or unusual hair thinning. She also denies any chest pain. Her most recent mammogram was at the beginning of this month and showed no abnormalities. The patient's breast density is category C, but this is not a concern as all her mammograms are 3D.  In addition to her DCIS, the patient has made significant lifestyle changes resulting in a weight loss of over 50 pounds. She reports feeling lighter, healthier, and stronger. Her A1c, which had been creeping up, is now within normal limits. She had been taking a medication  to suppress her appetite and aid in weight loss, but has since stopped due to side effects including a lack of hunger sensation and sickness if a dose is missed. She is  considering reducing the frequency of this medication to maintain her current weight.         ALLERGIES:  is allergic to orange oil.  MEDICATIONS:  Current Outpatient Medications  Medication Sig Dispense Refill   tamoxifen (NOLVADEX) 20 MG tablet TAKE 1 TABLET BY MOUTH EVERY DAY 90 tablet 3   WEGOVY 0.25 MG/0.5ML SOAJ Inject 0.25 mg into the skin every Monday.     aspirin EC 81 MG tablet Take 1 tablet (81 mg total) by mouth daily. (Patient not taking: Reported on 07/22/2023) 30 tablet 2   azelastine (ASTELIN) 0.1 % nasal spray Place 1-2 sprays into both nostrils 2 (two) times daily as needed. (Patient not taking: Reported on 07/22/2023) 30 mL 5   cholecalciferol (VITAMIN D3) 25 MCG (1000 UNIT) tablet Take 1 tablet (1,000 Units total) by mouth daily. (Patient not taking: Reported on 07/22/2023)     Fluocinolone Acetonide Body 0.01 % OIL Apply 1 Application topically daily as needed (allergic reaction). (Patient not taking: Reported on 07/22/2023)     levocetirizine (XYZAL) 5 MG tablet Take 5 mg by mouth daily as needed for allergies. (Patient not taking: Reported on 07/22/2023)     levonorgestrel (MIRENA) 20 MCG/DAY IUD 1 each by Intrauterine route once. (Patient not taking: Reported on 07/22/2023)     montelukast (SINGULAIR) 10 MG tablet Take 10 mg by mouth at bedtime as needed (allergies). (Patient not taking: Reported on 07/22/2023)     tizanidine (ZANAFLEX) 2 MG capsule Take 1 capsule (2 mg total) by mouth 3 (three) times daily as needed for muscle spasms. (Patient not taking: Reported on 07/22/2023) 60 capsule 0   zinc gluconate 50 MG tablet Take 1 tablet (50 mg total) by mouth daily. (Patient not taking: Reported on 07/22/2023)     No current facility-administered medications for this visit.    PHYSICAL EXAMINATION: ECOG PERFORMANCE STATUS: 1 - Symptomatic but completely ambulatory  Vitals:   07/22/23 0945  BP: (!) 128/95  Pulse: 80  Resp: 18  Temp: 97.7 F (36.5 C)  SpO2: 100%    Filed Weights   07/22/23 0945  Weight: 161 lb 8 oz (73.3 kg)      LABORATORY DATA:  I have reviewed the data as listed    Latest Ref Rng & Units 01/04/2023    9:29 AM 06/24/2019   12:29 PM  CMP  Glucose 70 - 99 mg/dL 85  89   BUN 6 - 20 mg/dL 14  12   Creatinine 1.61 - 1.00 mg/dL 0.96  0.45   Sodium 409 - 145 mmol/L 139  140   Potassium 3.5 - 5.1 mmol/L 4.0  3.8   Chloride 98 - 111 mmol/L 105  105   CO2 22 - 32 mmol/L 26  27   Calcium 8.9 - 10.3 mg/dL 8.6  9.2   Total Protein 6.5 - 8.1 g/dL  7.6   Total Bilirubin 0.3 - 1.2 mg/dL  0.3   Alkaline Phos 38 - 126 U/L  68   AST 15 - 41 U/L  13   ALT 0 - 44 U/L  10     Lab Results  Component Value Date   WBC 5.4 01/04/2023   HGB 12.3 01/04/2023   HCT 38.1 01/04/2023   MCV 85.0 01/04/2023   PLT 276  01/04/2023   NEUTROABS 4.4 06/24/2019    ASSESSMENT & PLAN:  Ductal carcinoma in situ (DCIS) of right breast 06/19/2019:Baseline routine screening mammogram showed two groups of indeterminate calcifications in the UOQ of the right breast, 1.2cm and 0.8cm. 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   07/22/2019: Right lumpectomy Carolynne Edouard): high grade DCIS, clear margins.  ER PR positive   Treatment plan: 1.  Adjuvant radiation therapy 08/27/2019-10/12/2019 2.  Adjuvant tamoxifen for 5 years   Tamoxifen toxicities: Very occasional hot flashes     Breast cancer surveillance: 1.  Breast exam 07/22/2023: Benign 2. Mammogram 06/25/2023: Benign breast density category C   She lost 50 pounds on Wegovy.  She works as a Engineer, technical sales and stays fairly busy. Left Hip replacement: 09/05/22 Planning right hip replacement next year   Return to clinic in 1 year for follow-up      Ductal Carcinoma in Situ (DCIS) Patient is in the 4th year of Tamoxifen treatment with one year remaining.  Reports occasional missed doses but overall consistent use. No significant side effects reported aside from mild hot flashes. -Continue Tamoxifen as prescribed. -Encourage consistent daily use.  Breast Health Recent mammogram (June 25, 2023) shows no abnormalities. Breast density is category C. -Continue annual mammograms. Next due September 2025.  Weight Management Significant weight loss achieved (50+ lbs), resulting in improved A1C levels. Patient is currently taking an unspecified medication to manage weight, but reports issues with appetite suppression and occasional sickness when skipping doses. -Consider reducing frequency of weight management medication to maintain current weight and manage side effects. -Continue monitoring weight and A1C levels.  Follow-up in one year or sooner if needed.          No orders of the defined types were placed in this encounter.  The patient has a good understanding of the overall plan. she agrees with it. she will call with any problems that may develop before the next visit here. Total time spent: 30 mins including face to face time and time spent for planning, charting and co-ordination of care   Tamsen Meek, MD 07/22/23

## 2023-08-17 ENCOUNTER — Emergency Department (HOSPITAL_BASED_OUTPATIENT_CLINIC_OR_DEPARTMENT_OTHER): Payer: BC Managed Care – PPO

## 2023-08-17 ENCOUNTER — Other Ambulatory Visit: Payer: Self-pay

## 2023-08-17 ENCOUNTER — Emergency Department (HOSPITAL_BASED_OUTPATIENT_CLINIC_OR_DEPARTMENT_OTHER)
Admission: EM | Admit: 2023-08-17 | Discharge: 2023-08-17 | Disposition: A | Discharge status: Home / Self Care | Payer: BC Managed Care – PPO | Attending: Emergency Medicine | Admitting: Emergency Medicine

## 2023-08-17 DIAGNOSIS — S161XXA Strain of muscle, fascia and tendon at neck level, initial encounter: Secondary | ICD-10-CM | POA: Diagnosis not present

## 2023-08-17 DIAGNOSIS — Z7982 Long term (current) use of aspirin: Secondary | ICD-10-CM | POA: Insufficient documentation

## 2023-08-17 DIAGNOSIS — S199XXA Unspecified injury of neck, initial encounter: Secondary | ICD-10-CM | POA: Diagnosis present

## 2023-08-17 DIAGNOSIS — Y92410 Unspecified street and highway as the place of occurrence of the external cause: Secondary | ICD-10-CM | POA: Diagnosis not present

## 2023-08-17 DIAGNOSIS — S76011A Strain of muscle, fascia and tendon of right hip, initial encounter: Secondary | ICD-10-CM | POA: Diagnosis not present

## 2023-08-17 MED ORDER — ACETAMINOPHEN 325 MG PO TABS
650.0000 mg | ORAL_TABLET | Freq: Once | ORAL | Status: AC
  Administered 2023-08-17: 650 mg via ORAL
  Filled 2023-08-17: qty 2
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
## 2023-08-17 NOTE — ED Notes (Signed)
ED Provider at bedside. 

## 2023-11-24 ENCOUNTER — Other Ambulatory Visit: Payer: Self-pay

## 2023-11-24 ENCOUNTER — Emergency Department (HOSPITAL_BASED_OUTPATIENT_CLINIC_OR_DEPARTMENT_OTHER)
Admission: EM | Admit: 2023-11-24 | Discharge: 2023-11-24 | Disposition: A | Discharge status: Home / Self Care | Payer: BC Managed Care – PPO | Attending: Emergency Medicine | Admitting: Emergency Medicine

## 2023-11-24 ENCOUNTER — Emergency Department (HOSPITAL_BASED_OUTPATIENT_CLINIC_OR_DEPARTMENT_OTHER): Payer: BC Managed Care – PPO

## 2023-11-24 ENCOUNTER — Encounter (HOSPITAL_BASED_OUTPATIENT_CLINIC_OR_DEPARTMENT_OTHER): Payer: Self-pay | Admitting: Emergency Medicine

## 2023-11-24 DIAGNOSIS — M25512 Pain in left shoulder: Secondary | ICD-10-CM | POA: Insufficient documentation

## 2023-11-24 DIAGNOSIS — R102 Pelvic and perineal pain: Secondary | ICD-10-CM | POA: Diagnosis not present

## 2023-11-24 DIAGNOSIS — Z7982 Long term (current) use of aspirin: Secondary | ICD-10-CM | POA: Insufficient documentation

## 2023-11-24 DIAGNOSIS — R519 Headache, unspecified: Secondary | ICD-10-CM | POA: Insufficient documentation

## 2023-11-24 DIAGNOSIS — M542 Cervicalgia: Secondary | ICD-10-CM | POA: Insufficient documentation

## 2023-11-24 MED ORDER — HYDROCODONE-ACETAMINOPHEN 5-325 MG PO TABS
1.0000 | ORAL_TABLET | Freq: Once | ORAL | Status: DC
  Filled 2023-11-24: qty 1

## 2023-11-24 MED ORDER — KETOROLAC TROMETHAMINE 30 MG/ML IJ SOLN
30.0000 mg | Freq: Once | INTRAMUSCULAR | Status: AC
  Administered 2023-11-24: 30 mg via INTRAMUSCULAR
  Filled 2023-11-24: qty 1

## 2023-11-24 MED ORDER — HYDROCODONE-ACETAMINOPHEN 5-325 MG PO TABS: 1.0000 | ORAL_TABLET | Freq: Four times a day (QID) | ORAL | 0 refills | Status: DC | PRN

## 2023-11-24 NOTE — Discharge Instructions (Signed)
You have been seen and discharged from the emergency department.  Your CT and x-ray imaging showed no acute injury.  You are suffering from musculoskeletal pain, strain.  Take Tylenol/ibuprofen as needed for pain control.  Use stronger pain medicine as prescribed and as needed.  Do not mix this medication with alcohol or other sedating medications. Do not drive or do heavy physical activity until you know how this medication affects you.  It may cause drowsiness.  Follow-up with your primary provider for further evaluation and further care. Take home medications as prescribed. If you have any worsening symptoms or further concerns for your health/safety please return to an emergency department for further evaluation.
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
## 2024-06-06 ENCOUNTER — Other Ambulatory Visit: Payer: Self-pay | Admitting: Hematology and Oncology

## 2024-06-08 ENCOUNTER — Other Ambulatory Visit: Payer: Self-pay | Admitting: Hematology and Oncology

## 2024-06-08 DIAGNOSIS — Z853 Personal history of malignant neoplasm of breast: Secondary | ICD-10-CM

## 2024-06-29 ENCOUNTER — Ambulatory Visit
Admission: RE | Admit: 2024-06-29 | Discharge: 2024-06-29 | Disposition: A | Discharge status: Home / Self Care | Source: Ambulatory Visit | Attending: Hematology and Oncology

## 2024-06-29 DIAGNOSIS — Z853 Personal history of malignant neoplasm of breast: Secondary | ICD-10-CM

## 2024-07-21 ENCOUNTER — Inpatient Hospital Stay: Payer: BC Managed Care – PPO | Attending: Hematology and Oncology | Admitting: Hematology and Oncology

## 2024-07-21 VITALS — BP 122/88 | HR 75 | Temp 98.5°F | Resp 18 | Ht 65.0 in | Wt 185.0 lb

## 2024-07-21 DIAGNOSIS — Z1721 Progesterone receptor positive status: Secondary | ICD-10-CM | POA: Diagnosis not present

## 2024-07-21 DIAGNOSIS — Z79810 Long term (current) use of selective estrogen receptor modulators (SERMs): Secondary | ICD-10-CM | POA: Diagnosis not present

## 2024-07-21 DIAGNOSIS — D0511 Intraductal carcinoma in situ of right breast: Secondary | ICD-10-CM | POA: Insufficient documentation

## 2024-07-21 DIAGNOSIS — Z923 Personal history of irradiation: Secondary | ICD-10-CM | POA: Insufficient documentation

## 2024-07-21 DIAGNOSIS — Z170 Estrogen receptor positive status [ER+]: Secondary | ICD-10-CM | POA: Diagnosis not present

## 2024-07-21 MED ORDER — SERTRALINE HCL 100 MG PO TABS: 100.0000 mg | ORAL_TABLET | Freq: Every day | ORAL | Status: AC

## 2024-07-21 NOTE — Progress Notes (Signed)
 Patient Care Team: Medicine, [facility] (Inactive) as PCP - General (Family Medicine) Curvin Deward MOULD, MD as Consulting Physician (General Surgery) Odean Potts, MD as Consulting Physician (Hematology and Oncology) Dewey Rush, MD as Consulting Physician (Radiation Oncology)  DIAGNOSIS:  Encounter Diagnosis  Name Primary?   Ductal carcinoma in situ (DCIS) of right breast Yes    SUMMARY OF ONCOLOGIC HISTORY: Oncology History  Ductal carcinoma in situ (DCIS) of right breast  06/19/2019 Initial Diagnosis   Baseline routine screening mammogram showed two groups of indeterminate calcifications in the UOQ of the right breast, 1.2cm and 0.8cm. 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   07/01/2019 Genetic Testing   Negative genetic testing. No pathogenic variants identified on the [facility] Breast cancer STAT Panel + Common Hereditary Cancers Panel. The STAT Breast cancer panel offered by [facility] includes sequencing and rearrangement analysis for the following 9 genes:  ATM, BRCA1, BRCA2, CDH1, CHEK2, PALB2, PTEN, STK11 and TP53.  The Common Hereditary Cancers Panel offered by [facility] includes sequencing and/or deletion duplication testing of the following 47 genes: APC, ATM, AXIN2, BARD1, BMPR1A, BRCA1, BRCA2, BRIP1, CDH1, CDKN2A (p14ARF), CDKN2A (p16INK4a), CKD4, CHEK2, CTNNA1, DICER1, EPCAM (Deletion/duplication testing only), GREM1 (promoter region deletion/duplication testing only), KIT, MEN1, MLH1, MSH2, MSH3, MSH6, MUTYH, NBN, NF1, NHTL1, PALB2, PDGFRA, PMS2, POLD1, POLE, PTEN, RAD50, RAD51C, RAD51D, SDHB, SDHC, SDHD, SMAD4, SMARCA4. STK11, TP53, TSC1, TSC2, and VHL.  The following genes were evaluated for sequence changes only: SDHA and HOXB13 c.251G>A variant only. The report date is 06/30/2019.   07/22/2019 Surgery   Right lumpectomy [facility]er)  [phone]): 5.2 cm high grade DCIS, clear margins.  Complex sclerosing lesion ER 90%, PR 100%. No regional lymph nodes were examined.   07/22/2019 Cancer Staging   Staging form: Breast, AJCC 8th Edition - Pathologic stage from 07/22/2019: Stage 0 (pTis (DCIS), pN0, cM0, ER+, PR+)   08/27/2019 - 10/12/2019 Radiation Therapy   The patient initially received a dose of 50.4 Gy in 28 fractions to the breast using whole-breast tangent fields. This was delivered using a 3-D conformal technique. The pt received a boost delivering an additional 10 Gy in 5 fractions using a electron boost with electrons. The total dose was 60.4 Gy.    09/2019 - 09/2024 Anti-estrogen oral therapy   Tamoxifen      CHIEF COMPLIANT: Surveillance of breast cancer on tamoxifen   HISTORY OF PRESENT ILLNESS: Ms. Dattilio is a 43 year old with above-mentioned history of DCIS who has been on tamoxifen  for the past 5 years.  She is tolerating tamoxifen  extremely well without any problems or concerns.  She is here for her annual routine follow-up.  She denies any lumps or nodules in the breast.      ALLERGIES:  is allergic to orange oil.  MEDICATIONS:  Current Outpatient Medications  Medication Sig Dispense Refill   sertraline (ZOLOFT) 100 MG tablet Take 1 tablet (100 mg total) by mouth daily.     levonorgestrel  (MIRENA ) 20 MCG/DAY IUD 1 each by Intrauterine route once. (Patient not taking: Reported on 07/22/2023)     No current facility-administered medications for this visit.    PHYSICAL EXAMINATION: ECOG PERFORMANCE STATUS: 1 - Symptomatic but completely ambulatory  Vitals:   07/21/24 1025  BP: 122/88  Pulse: 75  Resp: 18  Temp: 98.5 F (36.9 C)  SpO2: 100%  Filed Weights   07/21/24 1025  Weight: 185 lb (83.9 kg)      LABORATORY DATA:  I have reviewed the data as listed    Latest Ref Rng & Units 01/04/2023    9:29 AM 06/24/2019   12:29 PM  CMP  Glucose 70 - 99 mg/dL 85  89   BUN 6 - 20 mg/dL  14  12   Creatinine 9.55 - 1.00 mg/dL 9.17  9.20   Sodium 864 - 145 mmol/L 139  140   Potassium 3.5 - 5.1 mmol/L 4.0  3.8   Chloride 98 - 111 mmol/L 105  105   CO2 22 - 32 mmol/L 26  27   Calcium 8.9 - 10.3 mg/dL 8.6  9.2   Total Protein 6.5 - 8.1 g/dL  7.6   Total Bilirubin 0.3 - 1.2 mg/dL  0.3   Alkaline Phos 38 - 126 U/L  68   AST 15 - 41 U/L  13   ALT 0 - 44 U/L  10     Lab Results  Component Value Date   WBC 5.4 01/04/2023   HGB 12.3 01/04/2023   HCT 38.1 01/04/2023   MCV 85.0 01/04/2023   PLT 276 01/04/2023   NEUTROABS 4.4 06/24/2019    ASSESSMENT & PLAN:  Ductal carcinoma in situ (DCIS) of right breast 06/19/2019:Baseline routine screening mammogram showed two groups of indeterminate calcifications in the UOQ of the right breast, 1.2cm and 0.8cm. 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   07/22/2019: Right lumpectomy [facility]er): high grade DCIS, clear margins.  ER PR positive   Treatment plan: 1.  Adjuvant radiation therapy 08/27/2019-10/12/2019 2.  Adjuvant tamoxifen  for 5 years   Tamoxifen  toxicities: Very occasional hot flashes     Breast cancer surveillance: Mammogram 06/29/2024: Benign breast density category C   She lost 50 pounds on Wegovy .  She quit Wegovy  because of gastroparesis and nausea and vomiting.   She works as a Engineer, technical sales and stays fairly busy. Left Hip replacement: 09/05/22   Return to clinic on an as-needed basis      No orders of the defined types were placed in this encounter.  The patient has a good understanding of the overall plan. she agrees with it. she will call with any problems that may develop before the next visit here. Total time spent: 30 mins including face to face time and time spent for planning, charting and co-ordination of care   Viinay K Quade Ramirez, MD 07/21/24

## 2024-07-21 NOTE — Assessment & Plan Note (Signed)
 06/19/2019:Baseline routine screening mammogram showed two groups of indeterminate calcifications in the UOQ of the right breast, 1.2cm and 0.8cm. 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   07/22/2019: Right lumpectomy [facility]er): high grade DCIS, clear margins.  ER PR positive   Treatment plan: 1.  Adjuvant radiation therapy 08/27/2019-10/12/2019 2.  Adjuvant tamoxifen  for 5 years   Tamoxifen  toxicities: Very occasional hot flashes     Breast cancer surveillance: 1.  Breast exam 07/21/2024: Benign 2. Mammogram 06/29/2024: Benign breast density category C   She lost 50 pounds on Wegovy .   She works as a Engineer, technical sales and stays fairly busy. Left Hip replacement: 09/05/22 Planning right hip replacement next year   Return to clinic in 1 year for follow-up
